# Patient Record
Sex: Female | Born: 1975
Health system: Southern US, Community
[De-identification: ages and names within clinical notes are randomized; demographics above are authoritative.]

## PROBLEM LIST (undated history)

## (undated) DIAGNOSIS — E785 Hyperlipidemia, unspecified: Secondary | ICD-10-CM

## (undated) DIAGNOSIS — C801 Malignant (primary) neoplasm, unspecified: Secondary | ICD-10-CM

## (undated) DIAGNOSIS — K219 Gastro-esophageal reflux disease without esophagitis: Secondary | ICD-10-CM

## (undated) DIAGNOSIS — T7840XA Allergy, unspecified, initial encounter: Secondary | ICD-10-CM

## (undated) HISTORY — DX: Malignant (primary) neoplasm, unspecified: C80.1

## (undated) HISTORY — DX: Gastro-esophageal reflux disease without esophagitis: K21.9

## (undated) HISTORY — DX: Allergy, unspecified, initial encounter: T78.40XA

## (undated) HISTORY — PX: TONSILLECTOMY: SUR1361

## (undated) HISTORY — PX: OTHER SURGICAL HISTORY: SHX169

## (undated) HISTORY — DX: Hyperlipidemia, unspecified: E78.5

## (undated) HISTORY — PX: WISDOM TOOTH EXTRACTION: SHX21

---

## 2000-02-20 ENCOUNTER — Other Ambulatory Visit: Admission: RE | Admit: 2000-02-20 | Discharge: 2000-02-20 | Payer: Self-pay | Admitting: Obstetrics & Gynecology

## 2000-08-31 ENCOUNTER — Encounter: Payer: Self-pay | Admitting: Obstetrics and Gynecology

## 2000-09-01 ENCOUNTER — Inpatient Hospital Stay (HOSPITAL_COMMUNITY): Admission: AD | Admit: 2000-09-01 | Discharge: 2000-09-04 | Payer: Self-pay | Admitting: Obstetrics & Gynecology

## 2000-10-03 ENCOUNTER — Other Ambulatory Visit: Admission: RE | Admit: 2000-10-03 | Discharge: 2000-10-03 | Payer: Self-pay | Admitting: Obstetrics & Gynecology

## 2001-08-29 ENCOUNTER — Other Ambulatory Visit: Admission: RE | Admit: 2001-08-29 | Discharge: 2001-08-29 | Payer: Self-pay | Admitting: Obstetrics and Gynecology

## 2001-11-07 ENCOUNTER — Ambulatory Visit (HOSPITAL_COMMUNITY): Admission: RE | Admit: 2001-11-07 | Discharge: 2001-11-07 | Payer: Self-pay | Admitting: Obstetrics and Gynecology

## 2001-11-07 ENCOUNTER — Encounter: Payer: Self-pay | Admitting: Obstetrics and Gynecology

## 2002-03-06 ENCOUNTER — Inpatient Hospital Stay (HOSPITAL_COMMUNITY): Admission: AD | Admit: 2002-03-06 | Discharge: 2002-03-06 | Payer: Self-pay | Admitting: Obstetrics & Gynecology

## 2002-03-13 ENCOUNTER — Inpatient Hospital Stay (HOSPITAL_COMMUNITY): Admission: AD | Admit: 2002-03-13 | Discharge: 2002-03-16 | Payer: Self-pay | Admitting: Obstetrics & Gynecology

## 2002-10-27 ENCOUNTER — Other Ambulatory Visit: Admission: RE | Admit: 2002-10-27 | Discharge: 2002-10-27 | Payer: Self-pay | Admitting: Obstetrics & Gynecology

## 2003-11-03 ENCOUNTER — Other Ambulatory Visit: Admission: RE | Admit: 2003-11-03 | Discharge: 2003-11-03 | Payer: Self-pay | Admitting: Obstetrics & Gynecology

## 2004-07-20 ENCOUNTER — Inpatient Hospital Stay (HOSPITAL_COMMUNITY): Admission: RE | Admit: 2004-07-20 | Discharge: 2004-07-22 | Payer: Self-pay | Admitting: Obstetrics and Gynecology

## 2005-03-07 ENCOUNTER — Ambulatory Visit: Payer: Self-pay | Admitting: Family Medicine

## 2005-04-07 ENCOUNTER — Other Ambulatory Visit: Admission: RE | Admit: 2005-04-07 | Discharge: 2005-04-07 | Payer: Self-pay | Admitting: Obstetrics and Gynecology

## 2006-01-12 ENCOUNTER — Ambulatory Visit (HOSPITAL_COMMUNITY): Admission: RE | Admit: 2006-01-12 | Discharge: 2006-01-12 | Payer: Self-pay

## 2006-02-11 ENCOUNTER — Emergency Department (HOSPITAL_COMMUNITY): Admission: EM | Admit: 2006-02-11 | Discharge: 2006-02-11 | Payer: Self-pay | Admitting: Emergency Medicine

## 2006-09-26 ENCOUNTER — Ambulatory Visit: Payer: Self-pay | Admitting: Family Medicine

## 2008-09-03 ENCOUNTER — Ambulatory Visit: Payer: Self-pay | Admitting: Family Medicine

## 2008-09-03 DIAGNOSIS — M25569 Pain in unspecified knee: Secondary | ICD-10-CM | POA: Insufficient documentation

## 2008-09-23 ENCOUNTER — Ambulatory Visit: Payer: Self-pay | Admitting: Family Medicine

## 2008-09-23 DIAGNOSIS — L989 Disorder of the skin and subcutaneous tissue, unspecified: Secondary | ICD-10-CM | POA: Insufficient documentation

## 2008-09-25 LAB — CONVERTED CEMR LAB
ALT: 14 units/L (ref 0–35)
AST: 16 units/L (ref 0–37)
Albumin: 4 g/dL (ref 3.5–5.2)
Alkaline Phosphatase: 32 units/L — ABNORMAL LOW (ref 39–117)
BUN: 16 mg/dL (ref 6–23)
Basophils Absolute: 0 10*3/uL (ref 0.0–0.1)
Basophils Relative: 0.3 % (ref 0.0–3.0)
Bilirubin, Direct: 0.1 mg/dL (ref 0.0–0.3)
CO2: 27 meq/L (ref 19–32)
Calcium: 9.2 mg/dL (ref 8.4–10.5)
Chloride: 108 meq/L (ref 96–112)
Cholesterol: 217 mg/dL (ref 0–200)
Creatinine, Ser: 0.8 mg/dL (ref 0.4–1.2)
Direct LDL: 149.5 mg/dL
Eosinophils Absolute: 0.1 10*3/uL (ref 0.0–0.7)
Eosinophils Relative: 1.1 % (ref 0.0–5.0)
GFR calc Af Amer: 106 mL/min
GFR calc non Af Amer: 88 mL/min
Glucose, Bld: 89 mg/dL (ref 70–99)
HCT: 38.5 % (ref 36.0–46.0)
HDL: 53 mg/dL (ref >39.0)
Hemoglobin: 13.3 g/dL (ref 12.0–15.0)
Lymphocytes Relative: 37.7 % (ref 12.0–46.0)
MCHC: 34.6 g/dL (ref 30.0–36.0)
MCV: 89.9 fL (ref 78.0–100.0)
Monocytes Absolute: 0.4 10*3/uL (ref 0.1–1.0)
Monocytes Relative: 7.2 % (ref 3.0–12.0)
Neutro Abs: 2.8 10*3/uL (ref 1.4–7.7)
Neutrophils Relative %: 53.7 % (ref 43.0–77.0)
Platelets: 185 10*3/uL (ref 150–400)
Potassium: 4.2 meq/L (ref 3.5–5.1)
RBC: 4.28 M/uL (ref 3.87–5.11)
RDW: 11.7 % (ref 11.5–14.6)
Sodium: 139 meq/L (ref 135–145)
TSH: 1.59 microintl units/mL (ref 0.35–5.50)
Total Bilirubin: 0.7 mg/dL (ref 0.3–1.2)
Total CHOL/HDL Ratio: 4.1
Total Protein: 7.2 g/dL (ref 6.0–8.3)
Triglycerides: 58 mg/dL (ref 0–149)
VLDL: 12 mg/dL (ref 0–40)
WBC: 5.3 10*3/uL (ref 4.5–10.5)

## 2008-10-02 ENCOUNTER — Ambulatory Visit: Payer: Self-pay | Admitting: Family Medicine

## 2008-10-02 ENCOUNTER — Encounter (INDEPENDENT_AMBULATORY_CARE_PROVIDER_SITE_OTHER): Payer: Self-pay | Admitting: Internal Medicine

## 2010-06-28 ENCOUNTER — Encounter (INDEPENDENT_AMBULATORY_CARE_PROVIDER_SITE_OTHER): Payer: Self-pay | Admitting: *Deleted

## 2010-12-22 NOTE — Letter (Signed)
Summary: Nadara Eaton letter  [facility]  [address] [place], [place] [address]   Phone: [phone]  Fax: [phone]       06/28/2010 MRN: [id number]  [facility] [address] [place], [place]  [address]  Dear Ms. [facility],  [facility], and [facility] announce the retirement of Arta Silence, M.D., from full-time practice at the [facility] office effective May 19, 2010 and his plans of returning part-time.  It is important to Dr. Hetty Ely and to our practice that you understand that [facility] has seven physicians in our office for your health care needs.  We will continue to offer the same exceptional care that you have today.    Dr. Hetty Ely has spoken to many of you about his plans for retirement and returning part-time in the fall.   We will continue to work with you through the transition to schedule appointments for you in the office and meet the high standards that [facility] is committed to.   Again, it is with great pleasure that we share the news that Dr. Hetty Ely will return to [facility] in October of 2011 with a reduced schedule.    If you have any questions, or would like to request an appointment with one of our physicians, please call us at [phone] and press the option for Scheduling an appointment.  We take pleasure in providing you with excellent patient care and look forward to seeing you at your next office visit.  Our [facility] Physicians are:  Tillman Abide, M.D. Laurita Quint, M.D. Roxy Manns, M.D. Kerby Nora, M.D. Hannah Beat, M.D. Ruthe Mannan, M.D. We proudly welcomed Raechel Ache, M.D. and Eustaquio Boyden, M.D. to the practice in July/August 2011.  Sincerely,  [facility]

## 2011-04-07 NOTE — Discharge Summary (Signed)
NAMEMARSHAE, Krista Graham                  ACCOUNT NO.:  [id number]   MEDICAL RECORD NO.:  [id number]          PATIENT TYPE:  INP   LOCATION:  9134                          FACILITY:  [facility]   PHYSICIAN:  Miguel Aschoff, M.D.       DATE OF BIRTH:  11/21/1975   DATE OF ADMISSION:  07/20/2004  DATE OF DISCHARGE:  07/22/2004                                 DISCHARGE SUMMARY   FINAL DIAGNOSES:  1.  Intrauterine pregnancy at 66 and one-seventh weeks gestation.  2.  History of prior cesarean section, desires repeat cesarean section.   PROCEDURE:  Repeat low transverse cesarean section.  Surgeon:  Dr. Conley Simmonds.  Assistant:  Dr. Ilda Mori.  Complications:  None.   This 35 year old G3 P2-0-0-2 presents at 62 and one-seventh weeks gestation  with a history of prior cesarean section in 2003 secondary to a  nonreassuring fetal assessment.  The patient expressed her desires for a  repeat cesarean section throughout her antepartum course.  The patient's  antepartum course was otherwise uncomplicated.  She did have a negative  group B strep culture obtained in the office around 35 weeks.  The patient  was taken to the operating room on July 20, 2004 by Dr. Conley Simmonds where  a repeat low transverse cesarean section is performed with the delivery of a  7-pound 8-ounce female infant with Apgars of 8 and 9.  Delivery went without  complications.  The patient's postoperative course was benign without  significant fevers.  She was felt ready for discharge on postoperative day  #3.  She was sent home on a regular diet, told to decrease activity, told to  continue her prenatal vitamins, was given Tylox one to two q.3h. as needed  for pain, told she could use over-the-counter ibuprofen as needed, was to  follow up in the office in 4 weeks.   LABORATORY DATA ON DISCHARGE:  The patient had a hemoglobin of 10.2; white  blood cell count 10.8; and platelets 141,000.      MB/MEDQ  D:  08/16/2004  T:  08/16/2004   Job:  [id number]

## 2011-04-07 NOTE — Op Note (Signed)
[facility]  PatientADRIENNE, Graham Visit Number: [id number] MRN: [id number]          Service Type: OBS Location: 910A 9144 01 Attending Physician:  Mickle Mallory Dictated by:   Gerrit Friends. Aldona Bar, M.D. Proc. Date: 03/13/02 Admit Date:  03/13/2002                             Operative Report  PREOPERATIVE DIAGNOSES:       Term pregnancy, active labor, nonreassuring tracing.  POSTOPERATIVE DIAGNOSES:      Term pregnancy, active labor, nonreassuring labor, delivery of 8 pound 6 ounce female infant, Apgars 9 and 9, leg cord and body cord.  PROCEDURE:                    Primary low transverse cesarean section.  SURGEON:                      Gerrit Friends. Aldona Bar, M.D.  ANESTHESIA:                   Epidural.  HISTORY:                      This gravida 2, para 1 was admitted in labor on the evening of April 24.  At the time of admission she was 3-4 cm dilated, 80% effaced, vertex at -2 station.  As she was positive for group B Strep, she was prophylaxed with aqueous penicillin and managed expectantly.  At 3 a.m. on April 25 she was approximately 5 cm dilated.  An IUPC was placed and high dose Pitocin was instituted.  At 7:15 a.m. on April 25 she was 6 cm dilated.  Cervix was slightly thicker. Vertex still at -2/-1 and actual complete amniotomy occurred at this time. She was contracting well and her fetal heart was reactive.  I left the hospital and was called at 7:30 to return to the hospital because of a nonreassuring fetal heart tracing.  On examination the patients cervix was unchanged and the variables that were seen which were moderately severe were consistent with a cord problem and because it seemed that delivery was not eminent, decision was made to proceed with a stat cesarean section.  Patient was taken to the operating room for such procedure.  PROCEDURE:                    Patient was taken to the operating room with Foley catheter in  place.  Her epidural was augmented on her way to the operating room.  Once in the operating room she was positioned appropriately. Fetal heart was noted to be adequate and she was prepped and draped in the usual manner for cesarean section.  At this time the procedure was begun.  Pfannenstiel incision was made and with minimal difficulty dissected down sharply to and through the fascia in a low transverse fashion.  Subfascial space was created inferiorly and superiorly. Muscles separated in the midline.  Peritoneum identified appropriately and entered with care taken to avoid the bowel superiorly and the bladder inferiorly.  At this time the vesicouterine peritoneum was incised in a low transverse fashion and pushed off the lower uterine segment with ease.  Sharp incision of the uterus with Metzenbaum scissors was then made and extended laterally with fingers.  At this time delivery of a viable  female infant which cried spontaneously at once was carried out.  There was a body cord and leg cord noted at the time of delivery.  Once the cord was clamped and cut and the infant was passed off to the awaiting team, Apgars were assigned at 9 and 9, and ultimately the baby was taken to the nursery in good condition.  Weight was 8 pounds 6 ounces.  After the cord bloods were collected the placenta was delivered intact.  The uterus was then exteriorized and manually inspected and rendered free of any remaining products of conception.  Good uterine contractility was afforded with slowly given intravenous Pitocin and manual stimulation.  At this time closure of the uterine incision was carried out using #1 Vicryl in a running locked fashion and this was reinforced with several figure-of-eight #1 Vicryl. At this time tubes and ovaries were inspected and noted to be normal.  Uterus was replaced into the abdomen and abdomen lavaged of all free blood and clot. At this time closure of the abdomen was begun  after all counts were noted to be correct and no foreign bodies were noted to be remaining in the operative cavity.  The abdominoperitoneum was closed with 0 Vicryl in a running fashion and muscles secured with same.  Assured of good subfascial hemostasis, the fascia was then reapproximated with 0 Vicryl from angle to midline bilaterally.  Subcutaneous tissue was rendered hemostatic and staples were then used to close the skin.  Sterile pressure dressing was applied.  Patient was transferred to the recovery room in satisfactory condition after having tolerated procedure well.  Estimated blood loss 500 cc.  All counts correct x2.  SUMMARY:                      This patient presented with a nonreassuring fetal heart tracing which came about fairly suddenly and because of being fairly remote from delivery was taken to the operating room for a primary low transverse cesarean section and was found to have a leg cord and a body cord contributing to the moderately severe variables that the patient was experiencing while she was in labor.  She was delivered of an 8 pound 6 ounce female infant with Apgars of 9 and 9.  At the conclusion of the procedure both mother and baby were doing well in their respective recovery areas. Dictated by:   Gerrit Friends. Aldona Bar, M.D. Attending Physician:  Mickle Mallory DD:  03/14/02 TD:  03/14/02 Job: [id number]

## 2011-04-07 NOTE — Discharge Summary (Signed)
[facility]  PatientTIFFANNI, SCARFO Visit Number: [id number] MRN: [id number]          Service Type: OBS Location: 910A 9144 01 Attending Physician:  Mickle Mallory Dictated by:   Leilani Able, P.A. Admit Date:  03/13/2002 Discharge Date: 03/16/2002                             Discharge Summary  FINAL DIAGNOSES:              1. Intrauterine pregnancy at term.                               2. Active labor.                               3. Nonreassuring tracing.  PROCEDURE:                    Primary low transverse cesarean section.  SURGEON:                      Gerrit Friends. Aldona Bar, M.D.  COMPLICATIONS:                None.  HOSPITAL COURSE:              This 35 year old G2, P39, was admitted on the evening of March 13, 2002.  She was admitted in early labor.  She did have positive group B strep culture and therefore was started on a course of penicillin.  At 3 a.m. on the 25th, the patient was approximately 5 cm dilated, 9 EPCs were placed, and high-dose Pitocin was instituted.  On 7:15 a.m. on the 25th, the patient was about 6 cm dilated and cervix was slightly thicker, and vertex was at about a negative 2 to negative 1 station. At this point, her fetal heart strip was reactive.  Fifteen minutes later, Dr. Aldona Bar was called back to the hospital with a nonreassuring fetal heart tracing.  On examination, the patients cervix was unchanged, and variables were moderately severe and were consistent with a cord problem.  At this point, decision made to proceed with a stat cesarean section.  The patient was taken to the operating room by Dr. Annamaria Helling, where a primary low transverse cesarean section was performed with the delivery of an 8 pound 6 ounce female infant with Apgars of 9 and 9.  There was a leg and body cord noted.  The delivery went without complication.  The patients postoperative course was benign without significant fevers.  She  did have some postoperative anemia and was started on iron.  She was felt ready for discharge on postoperative day #2, was sent home on a regular diet.  Told to decrease activities.  DISCHARGE MEDICATIONS:        1. Chromagen q.d.                               2. Tylox 1-2 q.4h. p.r.n. pain.  FOLLOW-UP:                    Was told to follow up in the office in four weeks. Dictated by:   Leilani Able, P.A. Attending Physician:  Aldona Bar,  Rosita Fire DD:  04/03/02 TD:  04/06/02 Job: [id number]

## 2011-04-07 NOTE — Op Note (Signed)
Krista Graham, Krista Graham                            ACCOUNT NO.:  [id number]   MEDICAL RECORD NO.:  [id number]                   PATIENT TYPE:  INP   LOCATION:  9134                                 [facility]ILITY[facility]   PHYSICIAN:  Randye Lobo, M.D.                DATE OF BIRTH:  02-27-1976   DATE OF PROCEDURE:  07/20/2004  DATE OF DISCHARGE:                                 OPERATIVE REPORT   PREOPERATIVE DIAGNOSES:  1.  Intrauterine gestation at 38-1/7 weeks.  2.  History of prior cesarean section, desire for repeat cesarean section.   POSTOPERATIVE DIAGNOSES:  1.  Intrauterine gestation at 38-1/7 weeks.  2.  History of prior cesarean section, desire for repeat cesarean section.   PROCEDURE:  Repeat low segment transverse cesarean section.   SURGEON:  Randye Lobo, M.D.   ASSISTANT:  Ilda Mori, M.D.   ANESTHESIA:  Spinal.   INTRAVENOUS FLUIDS:  3000 mL Ringer's lactate.   ESTIMATED BLOOD LOSS:  800 mL.   URINE OUTPUT:  300 mL.   COMPLICATIONS:  None.   INDICATIONS:  The patient is a 35 year old gravida 3, para 2-0-0-2 Caucasian  female at 38-1/7 weeks' gestation who had a history of a prior cesarean  section in 2003 for a nonreassuring fetal assessment.  The patient expressed  a desire for a repeat cesarean section throughout her antepartum course.  A  discussion was held with the patient regarding risks, benefits and  alternatives and she chose to proceed with a repeat cesarean section.   FINDINGS:  A viable female was delivered at 12:01 p.m. with Apgars of 8 at one  minute and 9 at five minutes.  The weight was later noted to be 7 pounds and  11 ounces.  The placenta had a normal insertion of a three vessel cord.  The  uterus, tubes and ovaries were unremarkable.   SPECIMENS:  None.   DESCRIPTION OF PROCEDURE:  The patient was reidentified in the preoperative  holding area.  She was brought into the operating room where a spinal  anesthetic was administered.  She  was placed in the supine position with a  left lateral tilt, and the abdomen was prepped and a Foley catheter was  sterilely placed inside the bladder.  She was then sterilely draped.   A Pfannenstiel incision was created along the site of the patient's previous  incision.  This was performed sharply with the scalpel.  The incision was  carried down to the fascia using monopolar cautery.  The fascia was then  incised in the midline using the monopolar cautery.  The incision was  carried up bilaterally using Mayo scissors.  The rectus muscles were then  dissected off of the overlying fascia using a sharp dissection superiorly  and inferiorly.  It was apparent that the patient probably had a previous  diastasis recti.  The rectus muscles were  then sharply divided in the  midline where they were partially approximated more superiorly.  The  parietal peritoneum was then entered sharply, and the incision was extended  cranially and caudally with the Metzenbaum scissors.  The bladder retractor  was placed over the bladder and the bladder flap was then created sharply.  The lower uterine segment was then incised transversely with the scalpel and  the incision was extended bilaterally in an upward fashion with bandage  scissors.  The hand was inserted through the incision, and the vertex was  delivered without difficulty.  The nares and mouth and suctioned and the  remainder of the newborn was delivered without difficulty.  The cord was  doubly clamped and cut and the newborn was carried over to the awaiting  pediatricians.  Cord blood was obtained.  The patient received Ancef 1 g  intravenously as well as Pitocin 20 units intravenously.   The placenta was manually extracted and the uterus was exteriorized.  A  moistened lap pad was used to remove any remaining products of conception  from within the uterine cavity, and there were none.  The uterine incision  was closed with a double layer  closure of #1 chromic.  The first was a  running locked layer and the second was an imbricating layer.   The uterus was then returned to the peritoneal cavity after it was suctioned  of any remaining blood.  The abdomen was closed at this time as hemostasis  was excellent.  The peritoneum was closed with a running suture of 2-0  Vicryl.  The rectus muscles were reapproximated in the midline with  interrupted sutures of #1 chromic.  The fascia was closed with a running  suture of 0 Vicryl.  The subcutaneous tissue was irrigated and suctioned and  made hemostatic with monopolar cautery.  The skin was closed with staples  and a sterile bandage was placed over this.   There were no complications to the procedure.  All needle, instrument and  lap counts were correct.   The patient was escorted to the recovery room in stable condition.                                               Randye Lobo, M.D.    BES/MEDQ  D:  07/20/2004  T:  07/21/2004  Job:  [id number]

## 2011-11-21 DIAGNOSIS — C801 Malignant (primary) neoplasm, unspecified: Secondary | ICD-10-CM

## 2011-11-21 HISTORY — DX: Malignant (primary) neoplasm, unspecified: C80.1

## 2014-01-29 LAB — HM PAP SMEAR: HM PAP: NEGATIVE

## 2015-02-18 LAB — HM MAMMOGRAPHY

## 2017-07-30 ENCOUNTER — Encounter: Payer: Self-pay | Admitting: Adult Health

## 2017-07-30 ENCOUNTER — Encounter: Payer: Self-pay | Admitting: Family Medicine

## 2017-07-30 ENCOUNTER — Ambulatory Visit (INDEPENDENT_AMBULATORY_CARE_PROVIDER_SITE_OTHER): Payer: 59 | Admitting: Adult Health

## 2017-07-30 DIAGNOSIS — Z85828 Personal history of other malignant neoplasm of skin: Secondary | ICD-10-CM | POA: Insufficient documentation

## 2017-07-30 DIAGNOSIS — Z0000 Encounter for general adult medical examination without abnormal findings: Secondary | ICD-10-CM | POA: Diagnosis not present

## 2017-07-30 NOTE — Assessment & Plan Note (Signed)
Removed from forehead and L posterior back. Last OV with with Derm was 2016-advised to make appt for f/u. Recommended to use sunscreen and clothing to avoid excessive exposure to the sun.

## 2017-07-30 NOTE — Patient Instructions (Signed)
Heart-Healthy Eating Plan Many factors influence your heart health, including eating and exercise habits. Heart (coronary) risk increases with abnormal blood fat (lipid) levels. Heart-healthy meal planning includes limiting unhealthy fats, increasing healthy fats, and making other small dietary changes. This includes maintaining a healthy body weight to help keep lipid levels within a normal range. What is my plan? Your health care provider recommends that you:  Get no more than __25__% of the total calories in your daily diet from fat.  Limit your intake of saturated fat to less than __5__% of your total calories each day.  Limit the amount of cholesterol in your diet to less than __300__ mg per day.  What types of fat should I choose?  Choose healthy fats more often. Choose monounsaturated and polyunsaturated fats, such as olive oil and canola oil, flaxseeds, walnuts, almonds, and seeds.  Eat more omega-3 fats. Good choices include salmon, mackerel, sardines, tuna, flaxseed oil, and ground flaxseeds. Aim to eat fish at least two times each week.  Limit saturated fats. Saturated fats are primarily found in animal products, such as meats, butter, and cream. Plant sources of saturated fats include palm oil, palm kernel oil, and coconut oil.  Avoid foods with partially hydrogenated oils in them. These contain trans fats. Examples of foods that contain trans fats are stick margarine, some tub margarines, cookies, crackers, and other baked goods. What general guidelines do I need to follow?  Check food labels carefully to identify foods with trans fats or high amounts of saturated fat.  Fill one half of your plate with vegetables and green salads. Eat 4-5 servings of vegetables per day. A serving of vegetables equals 1 cup of raw leafy vegetables,  cup of raw or cooked cut-up vegetables, or  cup of vegetable juice.  Fill one fourth of your plate with whole grains. Look for the word "whole" as  the first word in the ingredient list.  Fill one fourth of your plate with lean protein foods.  Eat 4-5 servings of fruit per day. A serving of fruit equals one medium whole fruit,  cup of dried fruit,  cup of fresh, frozen, or canned fruit, or  cup of 100% fruit juice.  Eat more foods that contain soluble fiber. Examples of foods that contain this type of fiber are apples, broccoli, carrots, beans, peas, and barley. Aim to get 20-30 g of fiber per day.  Eat more home-cooked food and less restaurant, buffet, and fast food.  Limit or avoid alcohol.  Limit foods that are high in starch and sugar.  Avoid fried foods.  Cook foods by using methods other than frying. Baking, boiling, grilling, and broiling are all great options. Other fat-reducing suggestions include: ? Removing the skin from poultry. ? Removing all visible fats from meats. ? Skimming the fat off of stews, soups, and gravies before serving them. ? Steaming vegetables in water or broth.  Lose weight if you are overweight. Losing just 5-10% of your initial body weight can help your overall health and prevent diseases such as diabetes and heart disease.  Increase your consumption of nuts, legumes, and seeds to 4-5 servings per week. One serving of dried beans or legumes equals  cup after being cooked, one serving of nuts equals 1 ounces, and one serving of seeds equals  ounce or 1 tablespoon.  You may need to monitor your salt (sodium) intake, especially if you have high blood pressure. Talk with your health care provider or dietitian to get  more information about reducing sodium. What foods can I eat? Grains  Breads, including [place], white, pita, wheat, raisin, rye, oatmeal, and [place]. Tortillas that are neither fried nor made with lard or trans fat. Low-fat rolls, including hotdog and hamburger buns and English muffins. Biscuits. Muffins. Waffles. Pancakes. Light popcorn. Whole-grain cereals. Flatbread. Melba toast.  Pretzels. Breadsticks. Rusks. Low-fat snacks and crackers, including oyster, saltine, matzo, graham, animal, and rye. Rice and pasta, including brown rice and those that are made with whole wheat. Vegetables All vegetables. Fruits All fruits, but limit coconut. Meats and Other Protein Sources Lean, well-trimmed beef, veal, pork, and lamb. Chicken and [place] without skin. All fish and shellfish. Wild duck, rabbit, pheasant, and venison. Egg whites or low-cholesterol egg substitutes. Dried beans, peas, lentils, and tofu.Seeds and most nuts. Dairy Low-fat or nonfat cheeses, including ricotta, string, and mozzarella. Skim or 1% milk that is liquid, powdered, or evaporated. Buttermilk that is made with low-fat milk. Nonfat or low-fat yogurt. Beverages Mineral water. Diet carbonated beverages. Sweets and Desserts Sherbets and fruit ices. Honey, jam, marmalade, jelly, and syrups. Meringues and gelatins. Pure sugar candy, such as hard candy, jelly beans, gumdrops, mints, marshmallows, and small amounts of dark chocolate. [employer] Eat all sweets and desserts in moderation. Fats and Oils Nonhydrogenated (trans-free) margarines. Vegetable oils, including soybean, sesame, sunflower, olive, peanut, safflower, corn, canola, and cottonseed. Salad dressings or mayonnaise that are made with a vegetable oil. Limit added fats and oils that you use for cooking, baking, salads, and as spreads. Other Cocoa powder. Coffee and tea. All seasonings and condiments. The items listed above may not be a complete list of recommended foods or beverages. Contact your dietitian for more options. What foods are not recommended? Grains Breads that are made with saturated or trans fats, oils, or whole milk. Croissants. Butter rolls. Cheese breads. Sweet rolls. Donuts. Buttered popcorn. Chow mein noodles. High-fat crackers, such as cheese or butter crackers. Meats and Other Protein Sources Fatty meats, such as hotdogs,  short ribs, sausage, spareribs, bacon, ribeye roast or steak, and mutton. High-fat deli meats, such as salami and bologna. Caviar. Domestic duck and goose. Organ meats, such as kidney, liver, sweetbreads, brains, gizzard, chitterlings, and heart. Dairy Cream, sour cream, cream cheese, and creamed cottage cheese. Whole milk cheeses, including blue (bleu), Monterey Jack, [place], [place], [place], [place], Swiss, [place], [place], and [place]. Whole or 2% milk that is liquid, evaporated, or condensed. Whole buttermilk. Cream sauce or high-fat cheese sauce. Yogurt that is made from whole milk. Beverages Regular sodas and drinks with added sugar. Sweets and Desserts Frosting. Pudding. Cookies. Cakes other than angel food cake. Candy that has milk chocolate or white chocolate, hydrogenated fat, butter, coconut, or unknown ingredients. Buttered syrups. Full-fat ice cream or ice cream drinks. Fats and Oils Gravy that has suet, meat fat, or shortening. Cocoa butter, hydrogenated oils, palm oil, coconut oil, palm kernel oil. These can often be found in baked products, candy, fried foods, nondairy creamers, and whipped toppings. Solid fats and shortenings, including bacon fat, salt pork, lard, and butter. Nondairy cream substitutes, such as coffee creamers and sour cream substitutes. Salad dressings that are made of unknown oils, cheese, or sour cream. The items listed above may not be a complete list of foods and beverages to avoid. Contact your dietitian for more information. This information is not intended to replace advice given to you by your health care provider. Make sure you discuss any questions you have with your health care  provider. Document Released: 08/15/2008 Document Revised: 05/26/2016 Document Reviewed: 04/30/2014 Elsevier Interactive Patient Education  2017 [place]! Keep up the regular walking and healthy eating. Please keep your OB/GYN appt this Oct and make  follow-up with Dermatology this fall-if you need a referral please call us. Please schedule nurse visit for fasting labs and complete physical within the year. WELCOME TO THE PRACTICE!

## 2017-07-30 NOTE — Progress Notes (Signed)
Subjective:    Patient ID: Krista Graham, female    DOB: 07/11/1976, 41 y.o.   MRN: [id number]  HPI:  Krista Graham is here to establish as a new pt.  She is a very pleasant 41 year old female.  PMH:  Basal Cell Carcinoma on forehead and L shoulder both successfully removed in 2013.  She was being seen by derm every 6 months, however slacked off and last OV was 2016.  She has upcoming OB/GYN appt Oct 2018 to address PAP/mammogram.  She recently lost >40 lbs in last 2 years r/t regular exercise (walking and light  jogging) and following heart healthy diet.  She denies any acute issues/complaints. She estimates to drink 4-6 12 ounce bottles of water/day.  She is married with 3 teenage children (ages 72, 58, 70).  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 She denies tobacco use and reports only social EOTH consumption.  Patient Care Team    Relationship Specialty Notifications Start End  Krista Ghent, MD PCP - General Family Medicine  02/08/12     Patient Active Problem List   Diagnosis Date Noted  . Hx of basal cell carcinoma 07/30/2017  . Healthcare maintenance 07/30/2017  . SKIN LESION 09/23/2008  . KNEE PAIN, RIGHT 09/03/2008     Past Medical History:  Diagnosis Date  . Cancer [facility]) 2013   basal cell carcinoma     Past Surgical History:  Procedure Laterality Date  . CESAREAN SECTION    . TONSILLECTOMY       Family History  Problem Relation Age of Onset  . Hypothyroidism Mother   . Healthy Daughter   . Healthy Son   . Diabetes Maternal Grandmother   . Heart disease Maternal Grandmother   . Heart disease Maternal Grandfather   . Hypertension Maternal Grandfather   . COPD Maternal Grandfather   . Healthy Son      History  Drug Use No     History  Alcohol Use  . Yes    Comment: socially     History  Smoking Status  . Never Smoker  Smokeless Tobacco  . Never Used     No outpatient encounter prescriptions on file as of 07/30/2017.   No  facility-administered encounter medications on file as of 07/30/2017.     Allergies: Patient has no known allergies.  Body mass index is 24.33 kg/m.  Blood pressure 123/72, pulse 76, height 5' 7.25" (1.708 m), weight 156 lb 8 oz (71 kg), last menstrual period 07/05/2017.     Review of Systems  Constitutional: Positive for fatigue. Negative for activity change, appetite change, chills, diaphoresis, fever and unexpected weight change.  HENT: Negative for congestion.   Eyes: Negative for visual disturbance.  Respiratory: Negative for cough, chest tightness, shortness of breath, wheezing and stridor.   Cardiovascular: Negative for chest pain, palpitations and leg swelling.  Gastrointestinal: Negative for abdominal distention, abdominal pain, blood in stool, constipation, diarrhea, nausea and vomiting.  Endocrine: Negative for cold intolerance, heat intolerance, polydipsia, polyphagia and polyuria.  Genitourinary: Negative for difficulty urinating, flank pain and hematuria.  Musculoskeletal: Negative for arthralgias, back pain, gait problem, joint swelling, myalgias, neck pain and neck stiffness.  Skin: Negative for color change, pallor, rash and wound.  Allergic/Immunologic: Negative for immunocompromised state.  Neurological: Negative for dizziness, tremors, weakness and headaches.  Hematological: Does not bruise/bleed easily.  Psychiatric/Behavioral: Positive for sleep disturbance. Negative for decreased concentration, dysphoric mood, hallucinations, self-injury and suicidal  ideas. The patient is not nervous/anxious and is not hyperactive.        Objective:   Physical Exam  Constitutional: She is oriented to person, place, and time. She appears well-developed and well-nourished. No distress.  HENT:  Head: Normocephalic and atraumatic.  Right Ear: External ear normal.  Left Ear: External ear normal.  Eyes: Pupils are equal, round, and reactive to light. Conjunctivae are normal.   Neck: Normal range of motion.  Cardiovascular: Normal rate, regular rhythm and intact distal pulses.   No murmur heard. Pulmonary/Chest: No respiratory distress. She has no wheezes. She has no rales. She exhibits no tenderness.  Neurological: She is alert and oriented to person, place, and time.  Skin: Skin is warm and dry. No rash noted. She is not diaphoretic. No erythema. No pallor.  Psychiatric: She has a normal mood and affect. Her behavior is normal. Judgment and thought content normal.  Nursing note and vitals reviewed.         Assessment & Plan:   1. Hx of basal cell carcinoma   2. Healthcare maintenance     Hx of basal cell carcinoma Removed from forehead and L posterior back. Last OV with with Derm was 2016-advised to make appt for f/u. Recommended to use sunscreen and clothing to avoid excessive exposure to the sun.  Healthcare maintenance Continue adequate hydration and heart healthy diet. Continue regular walking/light jogging. Please keep OB/GYN appt in Oct and strongly recommend to make f/u with Derm (it has been >2 years since last OV, so if she needs referral instructed to call clinic). Schedule nurse visit for fasting labs and annual CPE.     FOLLOW-UP:  Return in about 1 year (around 07/30/2018) for CPE.

## 2017-07-30 NOTE — Assessment & Plan Note (Signed)
Continue adequate hydration and heart healthy diet. Continue regular walking/light jogging. Please keep OB/GYN appt in Oct and strongly recommend to make f/u with Derm (it has been >2 years since last OV, so if she needs referral instructed to call clinic). Schedule nurse visit for fasting labs and annual CPE.

## 2017-08-06 NOTE — Progress Notes (Signed)
Opened in error

## 2017-08-20 ENCOUNTER — Other Ambulatory Visit: Payer: Self-pay

## 2017-08-20 DIAGNOSIS — Z0000 Encounter for general adult medical examination without abnormal findings: Secondary | ICD-10-CM

## 2017-08-21 ENCOUNTER — Other Ambulatory Visit: Payer: 59

## 2017-08-21 DIAGNOSIS — Z0000 Encounter for general adult medical examination without abnormal findings: Secondary | ICD-10-CM

## 2017-08-22 LAB — CBC WITH DIFFERENTIAL/PLATELET
BASOS: 1 %
Basophils Absolute: 0 10*3/uL (ref 0.0–0.2)
EOS (ABSOLUTE): 0.1 10*3/uL (ref 0.0–0.4)
Eos: 1 %
HEMATOCRIT: 39 % (ref 34.0–46.6)
Hemoglobin: 13 g/dL (ref 11.1–15.9)
Immature Grans (Abs): 0 10*3/uL (ref 0.0–0.1)
Immature Granulocytes: 0 %
LYMPHS: 45 %
Lymphocytes Absolute: 2.2 10*3/uL (ref 0.7–3.1)
MCH: 30.4 pg (ref 26.6–33.0)
MCHC: 33.3 g/dL (ref 31.5–35.7)
MCV: 91 fL (ref 79–97)
Monocytes Absolute: 0.4 10*3/uL (ref 0.1–0.9)
Monocytes: 8 %
NEUTROS PCT: 45 %
Neutrophils Absolute: 2.2 10*3/uL (ref 1.4–7.0)
PLATELETS: 205 10*3/uL (ref 150–379)
RBC: 4.27 x10E6/uL (ref 3.77–5.28)
RDW: 12.9 % (ref 12.3–15.4)
WBC: 4.9 10*3/uL (ref 3.4–10.8)

## 2017-08-22 LAB — LIPID PANEL
CHOL/HDL RATIO: 3 ratio (ref 0.0–4.4)
Cholesterol, Total: 214 mg/dL — ABNORMAL HIGH (ref 100–199)
HDL: 71 mg/dL (ref >39)
LDL Calculated: 132 mg/dL — ABNORMAL HIGH (ref 0–99)
TRIGLYCERIDES: 56 mg/dL (ref 0–149)
VLDL Cholesterol Cal: 11 mg/dL (ref 5–40)

## 2017-08-22 LAB — COMPREHENSIVE METABOLIC PANEL
ALBUMIN: 4.2 g/dL (ref 3.5–5.5)
ALK PHOS: 35 IU/L — AB (ref 39–117)
ALT: 13 IU/L (ref 0–32)
AST: 15 IU/L (ref 0–40)
Albumin/Globulin Ratio: 1.6 (ref 1.2–2.2)
BUN / CREAT RATIO: 15 (ref 9–23)
BUN: 14 mg/dL (ref 6–24)
Bilirubin Total: 0.8 mg/dL (ref 0.0–1.2)
CALCIUM: 9.1 mg/dL (ref 8.7–10.2)
CO2: 24 mmol/L (ref 20–29)
CREATININE: 0.91 mg/dL (ref 0.57–1.00)
Chloride: 106 mmol/L (ref 96–106)
GFR, EST AFRICAN AMERICAN: 90 mL/min/{1.73_m2} (ref >59)
GFR, EST NON AFRICAN AMERICAN: 78 mL/min/{1.73_m2} (ref >59)
Globulin, Total: 2.6 g/dL (ref 1.5–4.5)
Glucose: 88 mg/dL (ref 65–99)
Potassium: 4.2 mmol/L (ref 3.5–5.2)
Sodium: 142 mmol/L (ref 134–144)
TOTAL PROTEIN: 6.8 g/dL (ref 6.0–8.5)

## 2017-08-22 LAB — HEMOGLOBIN A1C
Est. average glucose Bld gHb Est-mCnc: 97 mg/dL
HEMOGLOBIN A1C: 5 % (ref 4.8–5.6)

## 2017-08-22 LAB — VITAMIN D 25 HYDROXY (VIT D DEFICIENCY, FRACTURES): Vit D, 25-Hydroxy: 37 ng/mL (ref 30.0–100.0)

## 2017-08-22 LAB — TSH: TSH: 1.42 u[IU]/mL (ref 0.450–4.500)

## 2017-08-28 ENCOUNTER — Telehealth: Payer: Self-pay | Admitting: Adult Health

## 2017-08-28 NOTE — Telephone Encounter (Signed)
See lab results note.  Charyl Bigger, CMA

## 2017-08-28 NOTE — Telephone Encounter (Signed)
Pt states received a call frm Tonya on yesterday & is returning call --glh

## 2017-08-30 DIAGNOSIS — Z124 Encounter for screening for malignant neoplasm of cervix: Secondary | ICD-10-CM | POA: Diagnosis not present

## 2017-08-30 DIAGNOSIS — Z6823 Body mass index (BMI) 23.0-23.9, adult: Secondary | ICD-10-CM | POA: Diagnosis not present

## 2017-08-30 DIAGNOSIS — Z01419 Encounter for gynecological examination (general) (routine) without abnormal findings: Secondary | ICD-10-CM | POA: Diagnosis not present

## 2017-08-30 DIAGNOSIS — Z1231 Encounter for screening mammogram for malignant neoplasm of breast: Secondary | ICD-10-CM | POA: Diagnosis not present

## 2017-08-30 LAB — HM PAP SMEAR: HM PAP: NEGATIVE

## 2017-08-31 LAB — HM MAMMOGRAPHY

## 2017-10-01 ENCOUNTER — Encounter: Payer: 59 | Admitting: Adult Health

## 2017-12-03 ENCOUNTER — Encounter: Payer: Self-pay | Admitting: Adult Health

## 2017-12-03 ENCOUNTER — Ambulatory Visit (INDEPENDENT_AMBULATORY_CARE_PROVIDER_SITE_OTHER): Payer: 59 | Admitting: Adult Health

## 2017-12-03 VITALS — BP 99/63 | HR 65 | Ht 67.25 in | Wt 159.9 lb

## 2017-12-03 DIAGNOSIS — Z0000 Encounter for general adult medical examination without abnormal findings: Secondary | ICD-10-CM

## 2017-12-03 DIAGNOSIS — Z85828 Personal history of other malignant neoplasm of skin: Secondary | ICD-10-CM | POA: Diagnosis not present

## 2017-12-03 NOTE — Patient Instructions (Signed)
Heart-Healthy Eating Plan Many factors influence your heart health, including eating and exercise habits. Heart (coronary) risk increases with abnormal blood fat (lipid) levels. Heart-healthy meal planning includes limiting unhealthy fats, increasing healthy fats, and making other small dietary changes. This includes maintaining a healthy body weight to help keep lipid levels within a normal range. What is my plan? Your health care provider recommends that you:  Get no more than __25__% of the total calories in your daily diet from fat.  Limit your intake of saturated fat to less than __5___% of your total calories each day.  Limit the amount of cholesterol in your diet to less than __300__ mg per day.  What types of fat should I choose?  Choose healthy fats more often. Choose monounsaturated and polyunsaturated fats, such as olive oil and canola oil, flaxseeds, walnuts, almonds, and seeds.  Eat more omega-3 fats. Good choices include salmon, mackerel, sardines, tuna, flaxseed oil, and ground flaxseeds. Aim to eat fish at least two times each week.  Limit saturated fats. Saturated fats are primarily found in animal products, such as meats, butter, and cream. Plant sources of saturated fats include palm oil, palm kernel oil, and coconut oil.  Avoid foods with partially hydrogenated oils in them. These contain trans fats. Examples of foods that contain trans fats are stick margarine, some tub margarines, cookies, crackers, and other baked goods. What general guidelines do I need to follow?  Check food labels carefully to identify foods with trans fats or high amounts of saturated fat.  Fill one half of your plate with vegetables and green salads. Eat 4-5 servings of vegetables per day. A serving of vegetables equals 1 cup of raw leafy vegetables,  cup of raw or cooked cut-up vegetables, or  cup of vegetable juice.  Fill one fourth of your plate with whole grains. Look for the word "whole"  as the first word in the ingredient list.  Fill one fourth of your plate with lean protein foods.  Eat 4-5 servings of fruit per day. A serving of fruit equals one medium whole fruit,  cup of dried fruit,  cup of fresh, frozen, or canned fruit, or  cup of 100% fruit juice.  Eat more foods that contain soluble fiber. Examples of foods that contain this type of fiber are apples, broccoli, carrots, beans, peas, and barley. Aim to get 20-30 g of fiber per day.  Eat more home-cooked food and less restaurant, buffet, and fast food.  Limit or avoid alcohol.  Limit foods that are high in starch and sugar.  Avoid fried foods.  Cook foods by using methods other than frying. Baking, boiling, grilling, and broiling are all great options. Other fat-reducing suggestions include: ? Removing the skin from poultry. ? Removing all visible fats from meats. ? Skimming the fat off of stews, soups, and gravies before serving them. ? Steaming vegetables in water or broth.  Lose weight if you are overweight. Losing just 5-10% of your initial body weight can help your overall health and prevent diseases such as diabetes and heart disease.  Increase your consumption of nuts, legumes, and seeds to 4-5 servings per week. One serving of dried beans or legumes equals  cup after being cooked, one serving of nuts equals 1 ounces, and one serving of seeds equals  ounce or 1 tablespoon.  You may need to monitor your salt (sodium) intake, especially if you have high blood pressure. Talk with your health care provider or dietitian to get  more information about reducing sodium. What foods can I eat? Grains  Breads, including [place], white, pita, wheat, raisin, rye, oatmeal, and [place]. Tortillas that are neither fried nor made with lard or trans fat. Low-fat rolls, including hotdog and hamburger buns and English muffins. Biscuits. Muffins. Waffles. Pancakes. Light popcorn. Whole-grain cereals. Flatbread. Melba  toast. Pretzels. Breadsticks. Rusks. Low-fat snacks and crackers, including oyster, saltine, matzo, graham, animal, and rye. Rice and pasta, including brown rice and those that are made with whole wheat. Vegetables All vegetables. Fruits All fruits, but limit coconut. Meats and Other Protein Sources Lean, well-trimmed beef, veal, pork, and lamb. Chicken and [place] without skin. All fish and shellfish. Wild duck, rabbit, pheasant, and venison. Egg whites or low-cholesterol egg substitutes. Dried beans, peas, lentils, and tofu.Seeds and most nuts. Dairy Low-fat or nonfat cheeses, including ricotta, string, and mozzarella. Skim or 1% milk that is liquid, powdered, or evaporated. Buttermilk that is made with low-fat milk. Nonfat or low-fat yogurt. Beverages Mineral water. Diet carbonated beverages. Sweets and Desserts Sherbets and fruit ices. Honey, jam, marmalade, jelly, and syrups. Meringues and gelatins. Pure sugar candy, such as hard candy, jelly beans, gumdrops, mints, marshmallows, and small amounts of dark chocolate. [employer] Eat all sweets and desserts in moderation. Fats and Oils Nonhydrogenated (trans-free) margarines. Vegetable oils, including soybean, sesame, sunflower, olive, peanut, safflower, corn, canola, and cottonseed. Salad dressings or mayonnaise that are made with a vegetable oil. Limit added fats and oils that you use for cooking, baking, salads, and as spreads. Other Cocoa powder. Coffee and tea. All seasonings and condiments. The items listed above may not be a complete list of recommended foods or beverages. Contact your dietitian for more options. What foods are not recommended? Grains Breads that are made with saturated or trans fats, oils, or whole milk. Croissants. Butter rolls. Cheese breads. Sweet rolls. Donuts. Buttered popcorn. Chow mein noodles. High-fat crackers, such as cheese or butter crackers. Meats and Other Protein Sources Fatty meats, such as  hotdogs, short ribs, sausage, spareribs, bacon, ribeye roast or steak, and mutton. High-fat deli meats, such as salami and bologna. Caviar. Domestic duck and goose. Organ meats, such as kidney, liver, sweetbreads, brains, gizzard, chitterlings, and heart. Dairy Cream, sour cream, cream cheese, and creamed cottage cheese. Whole milk cheeses, including blue (bleu), Monte[place] Jack, [place], [place], [place], [place], [place], [place], [place], and [place]. Whole or 2% milk that is liquid, evaporated, or condensed. Whole buttermilk. Cream sauce or high-fat cheese sauce. Yogurt that is made from whole milk. Beverages Regular sodas and drinks with added sugar. Sweets and Desserts Frosting. Pudding. Cookies. Cakes other than angel food cake. Candy that has milk chocolate or white chocolate, hydrogenated fat, butter, coconut, or unknown ingredients. Buttered syrups. Full-fat ice cream or ice cream drinks. Fats and Oils Gravy that has suet, meat fat, or shortening. Cocoa butter, hydrogenated oils, palm oil, coconut oil, palm kernel oil. These can often be found in baked products, candy, fried foods, nondairy creamers, and whipped toppings. Solid fats and shortenings, including bacon fat, salt pork, lard, and butter. Nondairy cream substitutes, such as coffee creamers and sour cream substitutes. Salad dressings that are made of unknown oils, cheese, or sour cream. The items listed above may not be a complete list of foods and beverages to avoid. Contact your dietitian for more information. This information is not intended to replace advice given to you by your health care provider. Make sure you discuss any questions you have with your health care  provider. Document Released: 08/15/2008 Document Revised: 05/26/2016 Document Reviewed: 04/30/2014 Elsevier Interactive Patient Education  2018 [employer].  Increase water intake, strive for at least 80 ounces/day.   Follow Heart Healthy diet Continue regular  exercise.  Recommend at least 30 minutes daily, 5 days per week of walking, jogging, biking, swimming, YouTube/Pinterest workout videos. Please follow-up with your Dermatologist. OVERALL YOU ARE DOING A GREAT JOB!!! Annual follow-up, sooner if needed. NICE TO SEE YOU!

## 2017-12-03 NOTE — Assessment & Plan Note (Signed)
Increase water intake, strive for at least 80 ounces/day.   Follow Heart Healthy diet Continue regular exercise.  Recommend at least 30 minutes daily, 5 days per week of walking, jogging, biking, swimming, YouTube/Pinterest workout videos. Please follow-up with your Dermatologist. OVERALL YOU ARE DOING A GREAT JOB!!! Annual follow-up, sooner if needed.

## 2017-12-03 NOTE — Assessment & Plan Note (Signed)
F/u with Krista Graham- she is established pt and does not require referral.

## 2017-12-03 NOTE — Progress Notes (Signed)
Subjective:    Patient ID: Krista Graham, female    DOB: 22-Nov-1975, 42 y.o.   MRN: [id number]  HPI: 07/30/17 OV:  Krista Graham is here to establish as a new pt.  She is a very pleasant 42 year old female.  PMH:  Basal Cell Carcinoma on forehead and L shoulder both successfully removed in 2013.  She was being seen by derm every 6 months, however slacked off and last OV was 2016.  She has upcoming OB/GYN appt Oct 2018 to address PAP/mammogram.  She recently lost >40 lbs in last 2 years r/t regular exercise (walking and light  jogging) and following heart healthy diet.  She denies any acute issues/complaints. She estimates to drink 4-6 12 ounce bottles of water/day.  She is married with 3 teenage children (ages 65, 17, 70).  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 She denies tobacco use and reports only social EOTH consumption.  12/03/17 OV: Krista Graham is here for CPE.  She reports drinking plenty of water, following heart healthy diet, and walking/running 4 days a week.  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 She continues to abstain from tobacco and only consumes ETOH socially. Healthcare maintenance: Immunizations UTD Colonoscopy- not indicated Mammogram-completed 10/18-normal PAP-completed 10/18-normal  Patient Care Team    Relationship Specialty Notifications Start End  Mina Marble D, NP PCP - General Family Medicine  07/30/17   Ob/Gyn, Esmond Plants    07/30/17     Patient Active Problem List   Diagnosis Date Noted  . Hx of basal cell carcinoma 07/30/2017  . Healthcare maintenance 07/30/2017  . SKIN LESION 09/23/2008  . KNEE PAIN, RIGHT 09/03/2008     Past Medical History:  Diagnosis Date  . Cancer [facility]) 2013   basal cell carcinoma     Past Surgical History:  Procedure Laterality Date  . CESAREAN SECTION    . TONSILLECTOMY       Family History   Problem Relation Age of Onset  . Hypothyroidism Mother   . Healthy Daughter   . Healthy Son   . Diabetes Maternal Grandmother   . Heart disease Maternal Grandmother   . Heart disease Maternal Grandfather   . Hypertension Maternal Grandfather   . COPD Maternal Grandfather   . Healthy Son      Social History   Substance and Sexual Activity  Drug Use No     Social History   Substance and Sexual Activity  Alcohol Use Yes   Comment: socially     Social History   Tobacco Use  Smoking Status Never Smoker  Smokeless Tobacco Never Used     No outpatient encounter medications on file as of 12/03/2017.   No facility-administered encounter medications on file as of 12/03/2017.     Allergies: Patient has no known allergies.  Body mass index is 24.86 kg/m.  Blood pressure 99/63, pulse 65, height 5' 7.25" (1.708 m), weight 159 lb 14.4 oz (72.5 kg), last menstrual period 11/27/2017, SpO2 100 %.     Review of Systems  Constitutional: Positive for fatigue. Negative for activity change, appetite change, chills, diaphoresis, fever and unexpected weight change.  HENT: Negative for congestion.   Eyes: Negative for visual disturbance.  Respiratory: Negative for cough, chest tightness, shortness of breath, wheezing and stridor.   Cardiovascular: Negative for chest pain, palpitations  and leg swelling.  Gastrointestinal: Negative for abdominal distention, abdominal pain, blood in stool, constipation, diarrhea, nausea and vomiting.  Endocrine: Negative for cold intolerance, heat intolerance, polydipsia, polyphagia and polyuria.  Genitourinary: Negative for difficulty urinating, flank pain and hematuria.  Musculoskeletal: Negative for arthralgias, back pain, gait problem, joint swelling, myalgias, neck pain and neck stiffness.  Skin: Negative for color change, pallor, rash and wound.  Allergic/Immunologic: Negative for immunocompromised state.  Neurological: Negative for  dizziness, tremors, weakness and headaches.  Hematological: Does not bruise/bleed easily.  Psychiatric/Behavioral: Positive for sleep disturbance. Negative for decreased concentration, dysphoric mood, hallucinations, self-injury and suicidal ideas. The patient is not nervous/anxious and is not hyperactive.        Objective:   Physical Exam  Constitutional: She is oriented to person, place, and time. She appears well-developed and well-nourished. No distress.  HENT:  Head: Normocephalic and atraumatic.  Right Ear: Hearing, tympanic membrane, external ear and ear canal normal. Tympanic membrane is not erythematous and not bulging. No decreased hearing is noted.  Left Ear: Hearing, tympanic membrane, external ear and ear canal normal. Tympanic membrane is not erythematous and not bulging. No decreased hearing is noted.  Nose: No mucosal edema. Right sinus exhibits no maxillary sinus tenderness and no frontal sinus tenderness. Left sinus exhibits no maxillary sinus tenderness and no frontal sinus tenderness.  Mouth/Throat: Uvula is midline, oropharynx is clear and moist and mucous membranes are normal.  Eyes: Conjunctivae are normal. Pupils are equal, round, and reactive to light.  Neck: Normal range of motion. Neck supple.  Cardiovascular: Normal rate, regular rhythm and intact distal pulses.  No murmur heard. Pulmonary/Chest: No respiratory distress. She has no decreased breath sounds. She has no wheezes. She has no rhonchi. She has no rales. She exhibits no tenderness. Right breast exhibits no inverted nipple, no mass, no nipple discharge, no skin change and no tenderness. Left breast exhibits no inverted nipple, no mass, no nipple discharge, no skin change and no tenderness. Breasts are symmetrical.  Abdominal: Soft. Bowel sounds are normal. She exhibits no distension and no mass. There is no tenderness. There is no rebound and no guarding.  Genitourinary:  Genitourinary Comments: Declined PAP  today- complete with OB/GYN 10/18, last one normal  Musculoskeletal: Normal range of motion.  Lymphadenopathy:    She has no cervical adenopathy.  Neurological: She is alert and oriented to person, place, and time.  Skin: Skin is warm and dry. No rash noted. She is not diaphoretic. No erythema. No pallor.  Psychiatric: She has a normal mood and affect. Her behavior is normal. Judgment and thought content normal.  Nursing note and vitals reviewed.         Assessment & Plan:   1. Hx of basal cell carcinoma   2. Healthcare maintenance     Healthcare maintenance Increase water intake, strive for at least 80 ounces/day.   Follow Heart Healthy diet Continue regular exercise.  Recommend at least 30 minutes daily, 5 days per week of walking, jogging, biking, swimming, YouTube/Pinterest workout videos. Please follow-up with your Dermatologist. OVERALL YOU ARE DOING A GREAT JOB!!! Annual follow-up, sooner if needed.  Hx of basal cell carcinoma F/u with Lyndle Herrlich- she is established pt and does not require referral.    FOLLOW-UP:  Return in about 1 year (around 12/03/2018) for CPE, Fasting Labs.

## 2018-08-07 ENCOUNTER — Ambulatory Visit (INDEPENDENT_AMBULATORY_CARE_PROVIDER_SITE_OTHER): Payer: 59 | Admitting: Adult Health

## 2018-08-07 ENCOUNTER — Encounter: Payer: Self-pay | Admitting: Adult Health

## 2018-08-07 ENCOUNTER — Ambulatory Visit: Payer: 59

## 2018-08-07 VITALS — BP 124/78 | HR 82 | Temp 98.6°F | Resp 16 | Ht 67.0 in | Wt 177.0 lb

## 2018-08-07 DIAGNOSIS — M25562 Pain in left knee: Secondary | ICD-10-CM

## 2018-08-07 MED ORDER — MELOXICAM 7.5 MG PO TABS: 7.5000 mg | ORAL_TABLET | Freq: Two times a day (BID) | ORAL | 0 refills | Status: DC

## 2018-08-07 NOTE — Patient Instructions (Addendum)
Knee Pain, Adult Knee pain in adults is common. It can be caused by many things, including:  Arthritis.  A fluid-filled sac (cyst) or growth in your knee.  An infection in your knee.  An injury that will not heal.  Damage, swelling, or irritation of the tissues that support your knee.  Knee pain is usually not a sign of a serious problem. The pain may go away on its own with time and rest. If it does not, a health care provider may order tests to find the cause of the pain. These may include:  Imaging tests, such as an X-ray, MRI, or ultrasound.  Joint aspiration. In this test, fluid is removed from the knee.  Arthroscopy. In this test, a lighted tube is inserted into knee and an image is projected onto a TV screen.  A biopsy. In this test, a sample of tissue is removed from the body and studied under a microscope.  Follow these instructions at home: Pay attention to any changes in your symptoms. Take these actions to relieve your pain. Activity  Rest your knee.  Do not do things that cause pain or make pain worse.  Avoid high-impact activities or exercises, such as running, jumping rope, or doing jumping jacks. General instructions  Take over-the-counter and prescription medicines only as told by your health care provider.  Raise (elevate) your knee above the level of your heart when you are sitting or lying down.  Sleep with a pillow under your knee.  If directed, apply ice to the knee: ? Put ice in a plastic bag. ? Place a towel between your skin and the bag. ? Leave the ice on for 20 minutes, 2-3 times a day.  Ask your health care provider if you should wear an elastic knee support.  Lose weight if you are overweight. Extra weight can put pressure on your knee.  Do not use any products that contain nicotine or tobacco, such as cigarettes and e-cigarettes. Smoking may slow the healing of any bone and joint problems that you may have. If you need help quitting, ask  your health care provider. Contact a health care provider if:  Your knee pain continues, changes, or gets worse.  You have a fever along with knee pain.  Your knee buckles or locks up.  Your knee swells, and the swelling becomes worse. Get help right away if:  Your knee feels warm to the touch.  You cannot move your knee.  You have severe pain in your knee.  You have chest pain.  You have trouble breathing. Summary  Knee pain in adults is common. It can be caused by many things, including, arthritis, infection, cysts, or injury.  Knee pain is usually not a sign of a serious problem, but if it does not go away, a health care provider may perform tests to know the cause of the pain.  Pay attention to any changes in your symptoms. Relieve your pain with rest, medicines, light activity, and use of ice.  Get help if your pain continues or becomes very severe, or if your knee buckles or locks up, or if you have chest pain or trouble breathing. This information is not intended to replace advice given to you by your health care provider. Make sure you discuss any questions you have with your health care provider. Document Released: 09/03/2007 Document Revised: 10/27/2016 Document Reviewed: 10/27/2016 Elsevier Interactive Patient Education  2018 [employer].  Please use Meloxicam 7.5mg  twice daily as needed  for pain/edema. Recommend rest, elevation, and ice- 76mins several times/day. Xray completed today. Referral to Physical Therapy (PT) placed. Please follow-up after PT is complete. NICE TO SEE YOU

## 2018-08-07 NOTE — Assessment & Plan Note (Signed)
Please use Meloxicam 7.5mg  twice daily as needed for pain/edema. Recommend rest, elevation, and ice- 66mins several times/day. Xray -  Referral to Physical Therapy (PT) placed. Please follow-up after PT is complete.

## 2018-08-07 NOTE — Addendum Note (Signed)
Addended by: Mina Marble D on: 08/07/2018 02:53 PM   Modules accepted: Level of Service

## 2018-08-07 NOTE — Progress Notes (Signed)
Subjective:    Patient ID: Krista Graham, female    DOB: Jul 23, 1976, 42 y.o.   MRN: [id number]  HPI:  Ms. Krista Graham presents for L knee pain and edema that initially started 4-5 months ago. She denies acute injury/trauma prior to onset of sx's. She reports pain "on the side and behind my left knee". Pain will worsen with prolonged standing, walking. Pain will only minimally reduce with rest and OTC NSAIDs, She denies crepitus or instability of L knee. She denies numbness/tingling of L foot.  Patient Care Team    Relationship Specialty Notifications Start End  Mina Marble D, NP PCP - General Family Medicine  07/30/17   Ob/Gyn, Esmond Plants    07/30/17     Patient Active Problem List   Diagnosis Date Noted  . Acute pain of left knee 08/07/2018  . Hx of basal cell carcinoma 07/30/2017  . Healthcare maintenance 07/30/2017  . SKIN LESION 09/23/2008  . KNEE PAIN, RIGHT 09/03/2008     Past Medical History:  Diagnosis Date  . Cancer [facility]) 2013   basal cell carcinoma     Past Surgical History:  Procedure Laterality Date  . CESAREAN SECTION    . TONSILLECTOMY       Family History  Problem Relation Age of Onset  . Hypothyroidism Mother   . Healthy Daughter   . Healthy Son   . Diabetes Maternal Grandmother   . Heart disease Maternal Grandmother   . Heart disease Maternal Grandfather   . Hypertension Maternal Grandfather   . COPD Maternal Grandfather   . Healthy Son      Social History   Substance and Sexual Activity  Drug Use No     Social History   Substance and Sexual Activity  Alcohol Use Yes   Comment: socially     Social History   Tobacco Use  Smoking Status Never Smoker  Smokeless Tobacco Never Used     Outpatient Encounter Medications as of 08/07/2018  Medication Sig  . ibuprofen (ADVIL,MOTRIN) 200 MG tablet Take 200 mg by mouth every 6 (six) hours as needed.  . meloxicam (MOBIC) 7.5 MG tablet Take 1 tablet (7.5 mg total) by mouth 2 (two) times  daily.   No facility-administered encounter medications on file as of 08/07/2018.     Allergies: Patient has no known allergies.  Body mass index is 27.72 kg/m.  Blood pressure 124/78, pulse 82, temperature 98.6 F (37 C), resp. rate 16, height 5\' 7"  (1.702 m), weight 177 lb (80.3 kg), last menstrual period 07/04/2018, SpO2 100 %.  Review of Systems  Constitutional: Positive for fatigue. Negative for activity change, appetite change, chills, diaphoresis, fever and unexpected weight change.  Musculoskeletal: Positive for arthralgias, gait problem, joint swelling and myalgias. Negative for back pain, neck pain and neck stiffness.  Skin: Negative for color change, pallor, rash and wound.  Hematological: Does not bruise/bleed easily.       Objective:   Physical Exam  Constitutional: She is oriented to person, place, and time. She appears well-developed and well-nourished. No distress.  HENT:  Head: Normocephalic and atraumatic.  Right Ear: External ear normal.  Left Ear: External ear normal.  Nose: Nose normal.  Mouth/Throat: Oropharynx is clear and moist.  Eyes: Pupils are equal, round, and reactive to light. Conjunctivae and EOM are normal.  Musculoskeletal: She exhibits edema and tenderness. She exhibits no deformity.       Left hip: Normal.       Right knee:  Normal.       Left knee: She exhibits swelling. She exhibits normal range of motion, no ecchymosis, no erythema, normal alignment, no LCL laxity, normal patellar mobility and no bony tenderness. Tenderness found. Lateral joint line and patellar tendon tenderness noted.       Left ankle: Normal.  L Popliteal fossa  Neurological: She is alert and oriented to person, place, and time.  Skin: Skin is warm and dry. Capillary refill takes less than 2 seconds. No rash noted. She is not diaphoretic. No erythema. No pallor.  Psychiatric: She has a normal mood and affect. Her behavior is normal. Judgment and thought content normal.       Assessment & Plan:   1. Acute pain of left knee    Acute pain of left knee Please use Meloxicam 7.5mg  twice daily as needed for pain/edema. Recommend rest, elevation, and ice- 68mins several times/day. Xray -  Referral to Physical Therapy (PT) placed. Please follow-up after PT is complete.  FOLLOW-UP:  Return Follow-up after Physical Therapy is completed.

## 2018-08-27 ENCOUNTER — Ambulatory Visit: Payer: 59 | Attending: Adult Health

## 2018-08-27 ENCOUNTER — Other Ambulatory Visit: Payer: Self-pay

## 2018-08-27 DIAGNOSIS — M25562 Pain in left knee: Secondary | ICD-10-CM | POA: Insufficient documentation

## 2018-08-27 DIAGNOSIS — R600 Localized edema: Secondary | ICD-10-CM | POA: Insufficient documentation

## 2018-08-27 DIAGNOSIS — G8929 Other chronic pain: Secondary | ICD-10-CM | POA: Insufficient documentation

## 2018-08-27 DIAGNOSIS — M6281 Muscle weakness (generalized): Secondary | ICD-10-CM | POA: Insufficient documentation

## 2018-08-27 NOTE — Patient Instructions (Signed)
Hip Extension: 2-4 Inches    Tighten gluteal muscle. Lift one leg 10-20___ times. Restabilize pelvis. Repeat with other leg. Keep pelvis still. Be sure pelvis does not rotate and back does not arch. Do __1-2_ sets, _1__ times per day.                     ALSO DO WITH KNEE BENT  http://ss.exer.us/63   Copyright  VHI. All rights reserved.  Hip Flexion / Knee Extension: Straight-Leg Raise (Eccentric)    Lie on back. Lift leg with knee straight. Slowly lower leg for 3-5 seconds. _10-20__ reps per set, _1-2__ sets per day, _5-7__ days per week. Lower like elevator, stopping at each floor. Add ___ lbs when you achieve ___ repetitions. Rest on elbows. Rest on straight arms.  Copyright  VHI. All rights reserved.  Hip Abduction: Modified    Lying on right side with pillow between thighs, raise top leg from pillow, rotating slightly out. Repeat __10-20__ times per set. Do _1-2___ sets per session. Do __1-2__ sessions per day. DON'T ROLL LEG OUT  http://orth.exer.us/705   Copyright  VHI. All rights reserved.  Clam    Lie on side, legs bent 90. Open top knee to ceiling, rotating leg outward. Touch toes to ankle of bottom leg. Close knees, rotating leg inward. Maintain hip position. Repeat _10-20___ times. Repeat on other side. Do _1-2___ sessions per day.  http://pm.exer.us/69   Copyright  VHI. All rights reserved.

## 2018-08-27 NOTE — Therapy (Signed)
[place], [place], [address] Phone: [phone]   Fax:  [phone]  Physical Therapy Evaluation  Patient Details  Name: Krista Graham MRN: [id number] Date of Birth: 12/20/1975 Referring Provider (PT): Krista Marble, NP   Encounter Date: 08/27/2018  PT End of Session - 08/27/18 1036    Visit Number  1    Number of Visits  6    Date for PT Re-Evaluation  10/18/18    Authorization Type  [facility]    PT Start Time  1000    PT Stop Time  0140    PT Time Calculation (min)  940 min    Activity Tolerance  Patient tolerated treatment well;No increased pain    Behavior During Therapy  WFL for tasks assessed/performed       Past Medical History:  Diagnosis Date  . Cancer [facility]) 2013   basal cell carcinoma    Past Surgical History:  Procedure Laterality Date  . CESAREAN SECTION    . TONSILLECTOMY      There were no vitals filed for this visit.   Subjective Assessment - 08/27/18 1009    Subjective  Reports onset of pain without injury and knee began to have swelling about a month ago. Swelling occurs occasionally.  Bending give internal pain.     Limitations  --   does all normal activity.    How long can you walk comfortably?  As needed    Diagnostic tests  xray negative    Patient Stated Goals  To figure out why pain and decr pain    Currently in Pain?  No/denies    Pain Score  5    in past week   Pain Location  Knee    Pain Orientation  Left;Lateral;Posterior    Pain Descriptors / Indicators  Aching    Pain Type  Chronic pain    Pain Onset  More than a month ago    Pain Frequency  Intermittent    Aggravating Factors   bending  knee     Pain Relieving Factors  rest, medication , ice    Multiple Pain Sites  No         [facility] PT Assessment - 08/27/18 0001      Assessment   Medical Diagnosis  acute left knee pain.     Referring Provider (PT)  Krista Marble, NP    Onset Date/Surgical Date  --   5  months ago   Next MD Visit  11/2018    Prior Therapy  no      Precautions   Precautions  None      Restrictions   Weight Bearing Restrictions  No      Balance Screen   Has the patient fallen in the past 6 months  No      Prior Function   Level of Independence  Independent      Cognition   Overall Cognitive Status  Within Functional Limits for tasks assessed      Observation/Other Assessments   Focus on Therapeutic Outcomes (FOTO)   27% limited      ROM / Strength   AROM / PROM / Strength  AROM;PROM;Strength      AROM   AROM Assessment Site  Knee    Right/Left Knee  Right;Left      PROM   PROM Assessment Site  Knee    Right/Left Knee  Left;Right    Right Knee Extension  0    Right Knee Flexion  155    Left Knee Extension  0    Left Knee Flexion  148      Strength   Overall Strength Comments  WNL  RT leg LT hip and quads 4+/5       Flexibility   Soft Tissue Assessment /Muscle Length  yes    Hamstrings  SLR LT 60  RT 65      Normal ambulation .    Mild anterior knee edema          Objective measurements completed on examination: See above findings.              PT Education - 08/27/18 1036    Education Details  POC HEP    Person(s) Educated  Patient    Methods  Explanation;Demonstration;Tactile cues;Verbal cues;Handout    Comprehension  Returned demonstration;Verbalized understanding       PT Short Term Goals - 08/27/18 1045      PT SHORT TERM GOAL #1   Title  She will be independent in initial HEP     Time  1    Period  Weeks    Status  New      PT SHORT TERM GOAL #2   Title  She will report pain decreased 25% or mroe with normal walking activity    Time  3    Period  Weeks    Status  New        PT Long Term Goals - 08/27/18 1045      PT LONG TERM GOAL #1   Title  She will be indpendent with all hEp issued    Time  6    Period  Weeks    Status  New      PT LONG TERM GOAL #2   Title  She will have equal ROm Lt to RT  knee and no pain    Time  6    Period  Weeks    Status  New      PT LONG TERM GOAL #3   Title  She will report returning to normal walking program without pain    Time  6    Period  Weeks    Status  New      PT LONG TERM GOAL #4   Title  She will have 5/5 strength LT hip and knee     Time  6    Period  Weeks    Status  New             Plan - 08/27/18 1037    Clinical Impression Statement  Ms Krista Graham presents with decr ROM flexion  and LT knee pain , and LT hip and quad weakness.  These are not stopping activity but limiting time and frequency of activity.   Swelling and pain are variable     Clinical Presentation  Stable    Clinical Decision Making  Low    Rehab Potential  Good    PT Frequency  1x / week    PT Duration  6 weeks    PT Treatment/Interventions  Manual techniques;Patient/family education;Therapeutic exercise;Therapeutic activities;Cryotherapy;Iontophoresis 4mg /ml Dexamethasone;Taping    PT Next Visit Plan  REveiw HEP and add weight /bands if no problem . Trial of closed chain exer. modalities PRn    PT Home Exercise Plan  Clam and reverse clam sid lye, SLR , prone hip ext knee straingt and bent, side abduction  Consulted and Agree with Plan of Care  Patient       Patient will benefit from skilled therapeutic intervention in order to improve the following deficits and impairments:  Pain, Decreased activity tolerance, Decreased range of motion, Decreased strength, Difficulty walking  Visit Diagnosis: Localized edema - Plan: PT plan of care cert/re-cert  Muscle weakness (generalized) - Plan: PT plan of care cert/re-cert  Chronic pain of left knee - Plan: PT plan of care cert/re-cert     Problem List Patient Active Problem List   Diagnosis Date Noted  . Acute pain of left knee 08/07/2018  . Hx of basal cell carcinoma 07/30/2017  . Healthcare maintenance 07/30/2017  . SKIN LESION 09/23/2008  . KNEE PAIN, RIGHT 09/03/2008    Darrel Hoover   PT 08/27/2018, 10:52 AM  [facility] [address] [place], [place], [address] Phone: [phone]   Fax:  [phone]  Name: Krista Graham MRN: [id number] Date of Birth: 1976-04-11

## 2018-09-05 ENCOUNTER — Ambulatory Visit: Payer: 59

## 2018-09-05 DIAGNOSIS — G8929 Other chronic pain: Secondary | ICD-10-CM | POA: Diagnosis not present

## 2018-09-05 DIAGNOSIS — M25562 Pain in left knee: Secondary | ICD-10-CM | POA: Diagnosis not present

## 2018-09-05 DIAGNOSIS — R600 Localized edema: Secondary | ICD-10-CM

## 2018-09-05 DIAGNOSIS — M6281 Muscle weakness (generalized): Secondary | ICD-10-CM | POA: Diagnosis not present

## 2018-09-05 NOTE — Therapy (Addendum)
[place], [place], [address] Phone: [phone]   Fax:  [phone]  Physical Therapy Treatment/Discharge  Patient Details  Name: MITA VALLO MRN: [id number] Date of Birth: 02-14-1976 Referring Provider (PT): Mina Marble, NP   Encounter Date: 09/05/2018  PT End of Session - 09/05/18 1056    Visit Number  2    Number of Visits  6    Date for PT Re-Evaluation  10/18/18    Authorization Type  [facility]    PT Start Time  1055    PT Stop Time  1135    PT Time Calculation (min)  40 min    Activity Tolerance  Patient tolerated treatment well;No increased pain    Behavior During Therapy  WFL for tasks assessed/performed       Past Medical History:  Diagnosis Date  . Cancer [facility]) 2013   basal cell carcinoma    Past Surgical History:  Procedure Laterality Date  . CESAREAN SECTION    . TONSILLECTOMY      There were no vitals filed for this visit.  Subjective Assessment - 09/05/18 1058    Subjective  Doing well today.     Currently in Pain?  No/denies                       [facility] Adult PT Treatment/Exercise - 09/05/18 0001      Exercises   Exercises  Knee/Hip      Knee/Hip Exercises: Aerobic   Recumbent Bike  L3  5 min      Knee/Hip Exercises: Supine   Straight Leg Raises  Strengthening;Right;Left;10 reps    Straight Leg Raises Limitations  1 set no weight 1 set 3 pounds      Knee/Hip Exercises: Sidelying   Hip ABduction  Right;Left;2 sets;10 reps    Hip ABduction Limitations  1 set with 3 pounds    Clams  RT/Lt 2 sets 1 set with 3 pounds knee then reverse clam weight on ankle.       Knee/Hip Exercises: Prone   Straight Leg Raises  Right;Left;2 sets;10 reps    Straight Leg Raises Limitations  1 set with 3 pounds       See HEP for standing exercises      PT Education - 09/05/18 1141    Education Details  HEP and added bands for mat exercises    Person(s) Educated   Patient    Methods  Explanation;Demonstration;Tactile cues;Verbal cues;Handout    Comprehension  Returned demonstration;Verbalized understanding       PT Short Term Goals - 09/05/18 1142      PT SHORT TERM GOAL #1   Title  She will be independent in initial HEP     Status  Achieved      PT SHORT TERM GOAL #2   Title  She will report pain decreased 25% or mroe with normal walking activity    Status  On-going        PT Long Term Goals - 08/27/18 1045      PT LONG TERM GOAL #1   Title  She will be indpendent with all hEp issued    Time  6    Period  Weeks    Status  New      PT LONG TERM GOAL #2   Title  She will have equal ROm Lt to RT knee and no pain    Time  6  Period  Weeks    Status  New      PT LONG TERM GOAL #3   Title  She will report returning to normal walking program without pain    Time  6    Period  Weeks    Status  New      PT LONG TERM GOAL #4   Title  She will have 5/5 strength LT hip and knee     Time  6    Period  Weeks    Status  New            Plan - 09/05/18 1056    Clinical Impression Statement  Doing well with exercises. No  pain post .  Agreed to added HEP.     PT Treatment/Interventions  Manual techniques;Patient/family education;Therapeutic exercise;Therapeutic activities;Cryotherapy;Iontophoresis 68m/ml Dexamethasone;Taping    PT Next Visit Plan  REveiw HEP and add weight /bands if no problem . Trial of closed chain exer. modalities PRn    PT Home Exercise Plan  Clam and reverse clam sid lye, SLR , prone hip ext knee straingt and bent, side abduction, bands with mat exer, double hip hinge , step ups and downs.     Consulted and Agree with Plan of Care  Patient       Patient will benefit from skilled therapeutic intervention in order to improve the following deficits and impairments:  Pain, Decreased activity tolerance, Decreased range of motion, Decreased strength, Difficulty walking  Visit Diagnosis: Muscle weakness  (generalized)  Chronic pain of left knee  Localized edema     Problem List Patient Active Problem List   Diagnosis Date Noted  . Acute pain of left knee 08/07/2018  . Hx of basal cell carcinoma 07/30/2017  . Healthcare maintenance 07/30/2017  . SKIN LESION 09/23/2008  . KNEE PAIN, RIGHT 09/03/2008    CDarrel Hoover PT 09/05/2018, 11:43 AM  [facility] [place] [address]Phone: [phone]  Fax:  [phone] Name: MSHTERNA LARAMEEMRN: [id number] of Birth: [birth year] PHYSICAL THERAPY DISCHARGE SUMMARY  Visits from Start of Care: 2  Current functional level related to goals / functional outcomes: Unknown as she canceled the next 3 sessions and did not return   Remaining deficits: Unknown   Education / Equipment: HEP  Plan:                                                    Patient goals were not met. Patient is being discharged due to not returning since the last visit.  ?????    SPearson Forster PT  10/31/18

## 2018-09-05 NOTE — Patient Instructions (Signed)
Step: Up, Lateral    Stand with side toward step. Place involved leg up. Raise body using top leg only. Step off other side, involved leg first, lower body using other leg. Repeat ____ times per set. Do ____ sets per session. Do ____ sessions per week.  Copyright  VHI. All rights reserved.  Quad Strength: Quarter Squat With Tubing    With feet shoulder-width apart, stand on tubing and pull to hip height. Bend knees to 30-45. Return. Repeat ____ times or for ____ minutes. Do ____ sessions per day. CAUTION: You should not bend knees deep enough to cause pain.  http://cc.exer.us/15   Copyright  VHI. All rights reserved.

## 2018-09-13 ENCOUNTER — Ambulatory Visit: Payer: 59 | Admitting: Physical Therapy

## 2018-09-18 ENCOUNTER — Ambulatory Visit: Payer: 59 | Admitting: Physical Therapy

## 2018-09-25 ENCOUNTER — Ambulatory Visit: Payer: 59 | Admitting: Physical Therapy
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
## 2018-12-05 ENCOUNTER — Encounter: Payer: Self-pay | Admitting: Adult Health

## 2018-12-05 ENCOUNTER — Ambulatory Visit (INDEPENDENT_AMBULATORY_CARE_PROVIDER_SITE_OTHER): Payer: 59 | Admitting: Adult Health

## 2018-12-05 VITALS — BP 114/70 | HR 78 | Temp 98.6°F | Ht 67.25 in | Wt 170.7 lb

## 2018-12-05 DIAGNOSIS — Z0000 Encounter for general adult medical examination without abnormal findings: Secondary | ICD-10-CM | POA: Diagnosis not present

## 2018-12-05 DIAGNOSIS — M25561 Pain in right knee: Secondary | ICD-10-CM

## 2018-12-05 DIAGNOSIS — G4700 Insomnia, unspecified: Secondary | ICD-10-CM | POA: Diagnosis not present

## 2018-12-05 MED ORDER — LORAZEPAM 0.5 MG PO TABS: ORAL_TABLET | ORAL | 0 refills | Status: DC

## 2018-12-05 MED ORDER — MELOXICAM 7.5 MG PO TABS: 7.5000 mg | ORAL_TABLET | Freq: Two times a day (BID) | ORAL | 0 refills | Status: DC

## 2018-12-05 NOTE — Patient Instructions (Addendum)
Preventive Care for Adults, Female  A healthy lifestyle and preventive care can promote health and wellness. Preventive health guidelines for women include the following key practices.   A routine yearly physical is a good way to check with your health care provider about your health and preventive screening. It is a chance to share any concerns and updates on your health and to receive a thorough exam.   Visit your dentist for a routine exam and preventive care every 6 months. Brush your teeth twice a day and floss once a day. Good oral hygiene prevents tooth decay and gum disease.   The frequency of eye exams is based on your age, health, family medical history, use of contact lenses, and other factors. Follow your health care provider's recommendations for frequency of eye exams.   Eat a healthy diet. Foods like vegetables, fruits, whole grains, low-fat dairy products, and lean protein foods contain the nutrients you need without too many calories. Decrease your intake of foods high in solid fats, added sugars, and salt. Eat the right amount of calories for you.Get information about a proper diet from your health care provider, if necessary.   Regular physical exercise is one of the most important things you can do for your health. Most adults should get at least 150 minutes of moderate-intensity exercise (any activity that increases your heart rate and causes you to sweat) each week. In addition, most adults need muscle-strengthening exercises on 2 or more days a week.   Maintain a healthy weight. The body mass index (BMI) is a screening tool to identify possible weight problems. It provides an estimate of body fat based on height and weight. Your health care provider can find your BMI, and can help you achieve or maintain a healthy weight.For adults 20 years and older:   - A BMI below 18.5 is considered underweight.   - A BMI of 18.5 to 24.9 is normal.   - A BMI of 25 to 29.9 is  considered overweight.   - A BMI of 30 and above is considered obese.   Maintain normal blood lipids and cholesterol levels by exercising and minimizing your intake of trans and saturated fats.  Eat a balanced diet with plenty of fruit and vegetables. Blood tests for lipids and cholesterol should begin at age 20 and be repeated every 5 years minimum.  If your lipid or cholesterol levels are high, you are over 40, or you are at high risk for heart disease, you may need your cholesterol levels checked more frequently.Ongoing high lipid and cholesterol levels should be treated with medicines if diet and exercise are not working.   If you smoke, find out from your health care provider how to quit. If you do not use tobacco, do not start.   Lung cancer screening is recommended for adults aged 55-80 years who are at high risk for developing lung cancer because of a history of smoking. A yearly low-dose CT scan of the lungs is recommended for people who have at least a 30-pack-year history of smoking and are a current smoker or have quit within the past 15 years. A pack year of smoking is smoking an average of 1 pack of cigarettes a day for 1 year (for example: 1 pack a day for 30 years or 2 packs a day for 15 years). Yearly screening should continue until the smoker has stopped smoking for at least 15 years. Yearly screening should be stopped for people who develop a   health problem that would prevent them from having lung cancer treatment.   If you are pregnant, do not drink alcohol. If you are breastfeeding, be very cautious about drinking alcohol. If you are not pregnant and choose to drink alcohol, do not have more than 1 drink per day. One drink is considered to be 12 ounces (355 mL) of beer, 5 ounces (148 mL) of wine, or 1.5 ounces (44 mL) of liquor.   Avoid use of street drugs. Do not share needles with anyone. Ask for help if you need support or instructions about stopping the use of  drugs.   High blood pressure causes heart disease and increases the risk of stroke. Your blood pressure should be checked at least yearly.  Ongoing high blood pressure should be treated with medicines if weight loss and exercise do not work.   If you are 69-55 years old, ask your health care provider if you should take aspirin to prevent strokes.   Diabetes screening involves taking a blood sample to check your fasting blood sugar level. This should be done once every 3 years, after age 38, if you are within normal weight and without risk factors for diabetes. Testing should be considered at a younger age or be carried out more frequently if you are overweight and have at least 1 risk factor for diabetes.   Breast cancer screening is essential preventive care for women. You should practice "breast self-awareness."  This means understanding the normal appearance and feel of your breasts and may include breast self-examination.  Any changes detected, no matter how small, should be reported to a health care provider.  Women in their 80s and 30s should have a clinical breast exam (CBE) by a health care provider as part of a regular health exam every 1 to 3 years.  After age 66, women should have a CBE every year.  Starting at age 1, women should consider having a mammogram (breast X-ray test) every year.  Women who have a family history of breast cancer should talk to their health care provider about genetic screening.  Women at a high risk of breast cancer should talk to their health care providers about having an MRI and a mammogram every year.   -Breast cancer gene (BRCA)-related cancer risk assessment is recommended for women who have family members with BRCA-related cancers. BRCA-related cancers include breast, ovarian, tubal, and peritoneal cancers. Having family members with these cancers may be associated with an increased risk for harmful changes (mutations) in the breast cancer genes BRCA1 and  BRCA2. Results of the assessment will determine the need for genetic counseling and BRCA1 and BRCA2 testing.   The Pap test is a screening test for cervical cancer. A Pap test can show cell changes on the cervix that might become cervical cancer if left untreated. A Pap test is a procedure in which cells are obtained and examined from the lower end of the uterus (cervix).   - Women should have a Pap test starting at age 57.   - Between ages 90 and 70, Pap tests should be repeated every 2 years.   - Beginning at age 63, you should have a Pap test every 3 years as long as the past 3 Pap tests have been normal.   - Some women have medical problems that increase the chance of getting cervical cancer. Talk to your health care provider about these problems. It is especially important to talk to your health care provider if a  new problem develops soon after your last Pap test. In these cases, your health care provider may recommend more frequent screening and Pap tests.   - The above recommendations are the same for women who have or have not gotten the vaccine for human papillomavirus (HPV).   - If you had a hysterectomy for a problem that was not cancer or a condition that could lead to cancer, then you no longer need Pap tests. Even if you no longer need a Pap test, a regular exam is a good idea to make sure no other problems are starting.   - If you are between ages 36 and 66 years, and you have had normal Pap tests going back 10 years, you no longer need Pap tests. Even if you no longer need a Pap test, a regular exam is a good idea to make sure no other problems are starting.   - If you have had past treatment for cervical cancer or a condition that could lead to cancer, you need Pap tests and screening for cancer for at least 20 years after your treatment.   - If Pap tests have been discontinued, risk factors (such as a new sexual partner) need to be reassessed to determine if screening should  be resumed.   - The HPV test is an additional test that may be used for cervical cancer screening. The HPV test looks for the virus that can cause the cell changes on the cervix. The cells collected during the Pap test can be tested for HPV. The HPV test could be used to screen women aged 70 years and older, and should be used in women of any age who have unclear Pap test results. After the age of 67, women should have HPV testing at the same frequency as a Pap test.   Colorectal cancer can be detected and often prevented. Most routine colorectal cancer screening begins at the age of 57 years and continues through age 26 years. However, your health care provider may recommend screening at an earlier age if you have risk factors for colon cancer. On a yearly basis, your health care provider may provide home test kits to check for hidden blood in the stool.  Use of a small camera at the end of a tube, to directly examine the colon (sigmoidoscopy or colonoscopy), can detect the earliest forms of colorectal cancer. Talk to your health care provider about this at age 23, when routine screening begins. Direct exam of the colon should be repeated every 5 -10 years through age 49 years, unless early forms of pre-cancerous polyps or small growths are found.   People who are at an increased risk for hepatitis B should be screened for this virus. You are considered at high risk for hepatitis B if:  -You were born in a country where hepatitis B occurs often. Talk with your health care provider about which countries are considered high risk.  - Your parents were born in a high-risk country and you have not received a shot to protect against hepatitis B (hepatitis B vaccine).  - You have HIV or AIDS.  - You use needles to inject street drugs.  - You live with, or have sex with, someone who has Hepatitis B.  - You get hemodialysis treatment.  - You take certain medicines for conditions like cancer, organ  transplantation, and autoimmune conditions.   Hepatitis C blood testing is recommended for all people born from [birth year] through 1965 and any individual  with known risks for hepatitis C.   Practice safe sex. Use condoms and avoid high-risk sexual practices to reduce the spread of sexually transmitted infections (STIs). STIs include gonorrhea, chlamydia, syphilis, trichomonas, herpes, HPV, and human immunodeficiency virus (HIV). Herpes, HIV, and HPV are viral illnesses that have no cure. They can result in disability, cancer, and death. Sexually active women aged 25 years and younger should be checked for chlamydia. Older women with new or multiple partners should also be tested for chlamydia. Testing for other STIs is recommended if you are sexually active and at increased risk.   Osteoporosis is a disease in which the bones lose minerals and strength with aging. This can result in serious bone fractures or breaks. The risk of osteoporosis can be identified using a bone density scan. Women ages 65 years and over and women at risk for fractures or osteoporosis should discuss screening with their health care providers. Ask your health care provider whether you should take a calcium supplement or vitamin D to There are also several preventive steps women can take to avoid osteoporosis and resulting fractures or to keep osteoporosis from worsening. -->Recommendations include:  Eat a balanced diet high in fruits, vegetables, calcium, and vitamins.  Get enough calcium. The recommended total intake of is 1,200 mg daily; for best absorption, if taking supplements, divide doses into 250-500 mg doses throughout the day. Of the two types of calcium, calcium carbonate is best absorbed when taken with food but calcium citrate can be taken on an empty stomach.  Get enough vitamin D. NAMS and the National Osteoporosis Foundation recommend at least 1,000 IU per day for women age 50 and over who are at risk of vitamin D  deficiency. Vitamin D deficiency can be caused by inadequate sun exposure (for example, those who live in northern latitudes).  Avoid alcohol and smoking. Heavy alcohol intake (more than 7 drinks per week) increases the risk of falls and hip fracture and women smokers tend to lose bone more rapidly and have lower bone mass than nonsmokers. Stopping smoking is one of the most important changes women can make to improve their health and decrease risk for disease.  Be physically active every day. Weight-bearing exercise (for example, fast walking, hiking, jogging, and weight training) may strengthen bones or slow the rate of bone loss that comes with aging. Balancing and muscle-strengthening exercises can reduce the risk of falling and fracture.  Consider therapeutic medications. Currently, several types of effective drugs are available. Healthcare providers can recommend the type most appropriate for each woman.  Eliminate environmental factors that may contribute to accidents. Falls cause nearly 90% of all osteoporotic fractures, so reducing this risk is an important bone-health strategy. Measures include ample lighting, removing obstructions to walking, using nonskid rugs on floors, and placing mats and/or grab bars in showers.  Be aware of medication side effects. Some common medicines make bones weaker. These include a type of steroid drug called glucocorticoids used for arthritis and asthma, some antiseizure drugs, certain sleeping pills, treatments for endometriosis, and some cancer drugs. An overactive thyroid gland or using too much thyroid hormone for an underactive thyroid can also be a problem. If you are taking these medicines, talk to your doctor about what you can do to help protect your bones.reduce the rate of osteoporosis.    Menopause can be associated with physical symptoms and risks. Hormone replacement therapy is available to decrease symptoms and risks. You should talk to your  health care provider   about whether hormone replacement therapy is right for you.   Use sunscreen. Apply sunscreen liberally and repeatedly throughout the day. You should seek shade when your shadow is shorter than you. Protect yourself by wearing long sleeves, pants, a wide-brimmed hat, and sunglasses year round, whenever you are outdoors.   Once a month, do a whole body skin exam, using a mirror to look at the skin on your back. Tell your health care provider of new moles, moles that have irregular borders, moles that are larger than a pencil eraser, or moles that have changed in shape or color.   -Stay current with required vaccines (immunizations).   Influenza vaccine. All adults should be immunized every year.  Tetanus, diphtheria, and acellular pertussis (Td, Tdap) vaccine. Pregnant women should receive 1 dose of Tdap vaccine during each pregnancy. The dose should be obtained regardless of the length of time since the last dose. Immunization is preferred during the 27th 36th week of gestation. An adult who has not previously received Tdap or who does not know her vaccine status should receive 1 dose of Tdap. This initial dose should be followed by tetanus and diphtheria toxoids (Td) booster doses every 10 years. Adults with an unknown or incomplete history of completing a 3-dose immunization series with Td-containing vaccines should begin or complete a primary immunization series including a Tdap dose. Adults should receive a Td booster every 10 years.  Varicella vaccine. An adult without evidence of immunity to varicella should receive 2 doses or a second dose if she has previously received 1 dose. Pregnant females who do not have evidence of immunity should receive the first dose after pregnancy. This first dose should be obtained before leaving the health care facility. The second dose should be obtained 4 8 weeks after the first dose.  Human papillomavirus (HPV) vaccine. Females aged 13 26  years who have not received the vaccine previously should obtain the 3-dose series. The vaccine is not recommended for use in pregnant females. However, pregnancy testing is not needed before receiving a dose. If a female is found to be pregnant after receiving a dose, no treatment is needed. In that case, the remaining doses should be delayed until after the pregnancy. Immunization is recommended for any person with an immunocompromised condition through the age of 26 years if she did not get any or all doses earlier. During the 3-dose series, the second dose should be obtained 4 8 weeks after the first dose. The third dose should be obtained 24 weeks after the first dose and 16 weeks after the second dose.  Zoster vaccine. One dose is recommended for adults aged 60 years or older unless certain conditions are present.  Measles, mumps, and rubella (MMR) vaccine. Adults born before 1957 generally are considered immune to measles and mumps. Adults born in 1957 or later should have 1 or more doses of MMR vaccine unless there is a contraindication to the vaccine or there is laboratory evidence of immunity to each of the three diseases. A routine second dose of MMR vaccine should be obtained at least 28 days after the first dose for students attending postsecondary schools, health care workers, or international travelers. People who received inactivated measles vaccine or an unknown type of measles vaccine during 1963 1967 should receive 2 doses of MMR vaccine. People who received inactivated mumps vaccine or an unknown type of mumps vaccine before 1979 and are at high risk for mumps infection should consider immunization with 2 doses of   MMR vaccine. For females of childbearing age, rubella immunity should be determined. If there is no evidence of immunity, females who are not pregnant should be vaccinated. If there is no evidence of immunity, females who are pregnant should delay immunization until after pregnancy.  Unvaccinated health care workers born before [birth year] who lack laboratory evidence of measles, mumps, or rubella immunity or laboratory confirmation of disease should consider measles and mumps immunization with 2 doses of MMR vaccine or rubella immunization with 1 dose of MMR vaccine.  Pneumococcal 13-valent conjugate (PCV13) vaccine. When indicated, a person who is uncertain of her immunization history and has no record of immunization should receive the PCV13 vaccine. An adult aged 54 years or older who has certain medical conditions and has not been previously immunized should receive 1 dose of PCV13 vaccine. This PCV13 should be followed with a dose of pneumococcal polysaccharide (PPSV23) vaccine. The PPSV23 vaccine dose should be obtained at least 8 weeks after the dose of PCV13 vaccine. An adult aged 58 years or older who has certain medical conditions and previously received 1 or more doses of PPSV23 vaccine should receive 1 dose of PCV13. The PCV13 vaccine dose should be obtained 1 or more years after the last PPSV23 vaccine dose.  Pneumococcal polysaccharide (PPSV23) vaccine. When PCV13 is also indicated, PCV13 should be obtained first. All adults aged 58 years and older should be immunized. An adult younger than age 65 years who has certain medical conditions should be immunized. Any person who resides in a nursing home or long-term care facility should be immunized. An adult smoker should be immunized. People with an immunocompromised condition and certain other conditions should receive both PCV13 and PPSV23 vaccines. People with human immunodeficiency virus (HIV) infection should be immunized as soon as possible after diagnosis. Immunization during chemotherapy or radiation therapy should be avoided. Routine use of PPSV23 vaccine is not recommended for American Indians, [place] Natives, or people younger than 65 years unless there are medical conditions that require PPSV23 vaccine. When indicated,  people who have unknown immunization and have no record of immunization should receive PPSV23 vaccine. One-time revaccination 5 years after the first dose of PPSV23 is recommended for people aged 70 64 years who have chronic kidney failure, nephrotic syndrome, asplenia, or immunocompromised conditions. People who received 1 2 doses of PPSV23 before age 32 years should receive another dose of PPSV23 vaccine at age 96 years or later if at least 5 years have passed since the previous dose. Doses of PPSV23 are not needed for people immunized with PPSV23 at or after age 55 years.  Meningococcal vaccine. Adults with asplenia or persistent complement component deficiencies should receive 2 doses of quadrivalent meningococcal conjugate (MenACWY-D) vaccine. The doses should be obtained at least 2 months apart. Microbiologists working with certain meningococcal bacteria, [employer] recruits, people at risk during an outbreak, and people who travel to or live in countries with a high rate of meningitis should be immunized. A first-year college student up through age 58 years who is living in a residence hall should receive a dose if she did not receive a dose on or after her 16th birthday. Adults who have certain high-risk conditions should receive one or more doses of vaccine.  Hepatitis A vaccine. Adults who wish to be protected from this disease, have certain high-risk conditions, work with hepatitis A-infected animals, work in hepatitis A research labs, or travel to or work in countries with a high rate of hepatitis A should be  immunized. Adults who were previously unvaccinated and who anticipate close contact with an international adoptee during the first 60 days after arrival in the [place] from a country with a high rate of hepatitis A should be immunized.  Hepatitis B vaccine.  Adults who wish to be protected from this disease, have certain high-risk conditions, may be exposed to blood or other infectious  body fluids, are household contacts or sex partners of hepatitis B positive people, are clients or workers in certain care facilities, or travel to or work in countries with a high rate of hepatitis B should be immunized.  Haemophilus influenzae type b (Hib) vaccine. A previously unvaccinated person with asplenia or sickle cell disease or having a scheduled splenectomy should receive 1 dose of Hib vaccine. Regardless of previous immunization, a recipient of a hematopoietic stem cell transplant should receive a 3-dose series 6 12 months after her successful transplant. Hib vaccine is not recommended for adults with HIV infection.  Preventive Services / Frequency Ages 6 to 39years  Blood pressure check.** / Every 1 to 2 years.  Lipid and cholesterol check.** / Every 5 years beginning at age 39.  Clinical breast exam.** / Every 3 years for women in their 61s and 62s.  BRCA-related cancer risk assessment.** / For women who have family members with a BRCA-related cancer (breast, ovarian, tubal, or peritoneal cancers).  Pap test.** / Every 2 years from ages 47 through 85. Every 3 years starting at age 34 through age 12 or 74 with a history of 3 consecutive normal Pap tests.  HPV screening.** / Every 3 years from ages 46 through ages 43 to 54 with a history of 3 consecutive normal Pap tests.  Hepatitis C blood test.** / For any individual with known risks for hepatitis C.  Skin self-exam. / Monthly.  Influenza vaccine. / Every year.  Tetanus, diphtheria, and acellular pertussis (Tdap, Td) vaccine.** / Consult your health care provider. Pregnant women should receive 1 dose of Tdap vaccine during each pregnancy. 1 dose of Td every 10 years.  Varicella vaccine.** / Consult your health care provider. Pregnant females who do not have evidence of immunity should receive the first dose after pregnancy.  HPV vaccine. / 3 doses over 6 months, if 64 and younger. The vaccine is not recommended for use in  pregnant females. However, pregnancy testing is not needed before receiving a dose.  Measles, mumps, rubella (MMR) vaccine.** / You need at least 1 dose of MMR if you were born in 1957 or later. You may also need a 2nd dose. For females of childbearing age, rubella immunity should be determined. If there is no evidence of immunity, females who are not pregnant should be vaccinated. If there is no evidence of immunity, females who are pregnant should delay immunization until after pregnancy.  Pneumococcal 13-valent conjugate (PCV13) vaccine.** / Consult your health care provider.  Pneumococcal polysaccharide (PPSV23) vaccine.** / 1 to 2 doses if you smoke cigarettes or if you have certain conditions.  Meningococcal vaccine.** / 1 dose if you are age 71 to 37 years and a Market researcher living in a residence hall, or have one of several medical conditions, you need to get vaccinated against meningococcal disease. You may also need additional booster doses.  Hepatitis A vaccine.** / Consult your health care provider.  Hepatitis B vaccine.** / Consult your health care provider.  Haemophilus influenzae type b (Hib) vaccine.** / Consult your health care provider.  Ages 55 to 64years  Blood pressure check.** / Every 1 to 2 years.  Lipid and cholesterol check.** / Every 5 years beginning at age 20 years.  Lung cancer screening. / Every year if you are aged 55 80 years and have a 30-pack-year history of smoking and currently smoke or have quit within the past 15 years. Yearly screening is stopped once you have quit smoking for at least 15 years or develop a health problem that would prevent you from having lung cancer treatment.  Clinical breast exam.** / Every year after age 40 years.  BRCA-related cancer risk assessment.** / For women who have family members with a BRCA-related cancer (breast, ovarian, tubal, or peritoneal cancers).  Mammogram.** / Every year beginning at age 40  years and continuing for as long as you are in good health. Consult with your health care provider.  Pap test.** / Every 3 years starting at age 30 years through age 65 or 70 years with a history of 3 consecutive normal Pap tests.  HPV screening.** / Every 3 years from ages 30 years through ages 65 to 70 years with a history of 3 consecutive normal Pap tests.  Fecal occult blood test (FOBT) of stool. / Every year beginning at age 50 years and continuing until age 75 years. You may not need to do this test if you get a colonoscopy every 10 years.  Flexible sigmoidoscopy or colonoscopy.** / Every 5 years for a flexible sigmoidoscopy or every 10 years for a colonoscopy beginning at age 50 years and continuing until age 75 years.  Hepatitis C blood test.** / For all people born from 1945 through 1965 and any individual with known risks for hepatitis C.  Skin self-exam. / Monthly.  Influenza vaccine. / Every year.  Tetanus, diphtheria, and acellular pertussis (Tdap/Td) vaccine.** / Consult your health care provider. Pregnant women should receive 1 dose of Tdap vaccine during each pregnancy. 1 dose of Td every 10 years.  Varicella vaccine.** / Consult your health care provider. Pregnant females who do not have evidence of immunity should receive the first dose after pregnancy.  Zoster vaccine.** / 1 dose for adults aged 60 years or older.  Measles, mumps, rubella (MMR) vaccine.** / You need at least 1 dose of MMR if you were born in 1957 or later. You may also need a 2nd dose. For females of childbearing age, rubella immunity should be determined. If there is no evidence of immunity, females who are not pregnant should be vaccinated. If there is no evidence of immunity, females who are pregnant should delay immunization until after pregnancy.  Pneumococcal 13-valent conjugate (PCV13) vaccine.** / Consult your health care provider.  Pneumococcal polysaccharide (PPSV23) vaccine.** / 1 to 2 doses if  you smoke cigarettes or if you have certain conditions.  Meningococcal vaccine.** / Consult your health care provider.  Hepatitis A vaccine.** / Consult your health care provider.  Hepatitis B vaccine.** / Consult your health care provider.  Haemophilus influenzae type b (Hib) vaccine.** / Consult your health care provider.  Ages 65 years and over  Blood pressure check.** / Every 1 to 2 years.  Lipid and cholesterol check.** / Every 5 years beginning at age 20 years.  Lung cancer screening. / Every year if you are aged 55 80 years and have a 30-pack-year history of smoking and currently smoke or have quit within the past 15 years. Yearly screening is stopped once you have quit smoking for at least 15 years or develop a health problem that   would prevent you from having lung cancer treatment.  Clinical breast exam.** / Every year after age 103 years.  BRCA-related cancer risk assessment.** / For women who have family members with a BRCA-related cancer (breast, ovarian, tubal, or peritoneal cancers).  Mammogram.** / Every year beginning at age 36 years and continuing for as long as you are in good health. Consult with your health care provider.  Pap test.** / Every 3 years starting at age 5 years through age 85 or 10 years with 3 consecutive normal Pap tests. Testing can be stopped between 65 and 70 years with 3 consecutive normal Pap tests and no abnormal Pap or HPV tests in the past 10 years.  HPV screening.** / Every 3 years from ages 93 years through ages 70 or 45 years with a history of 3 consecutive normal Pap tests. Testing can be stopped between 65 and 70 years with 3 consecutive normal Pap tests and no abnormal Pap or HPV tests in the past 10 years.  Fecal occult blood test (FOBT) of stool. / Every year beginning at age 8 years and continuing until age 45 years. You may not need to do this test if you get a colonoscopy every 10 years.  Flexible sigmoidoscopy or colonoscopy.** /  Every 5 years for a flexible sigmoidoscopy or every 10 years for a colonoscopy beginning at age 69 years and continuing until age 68 years.  Hepatitis C blood test.** / For all people born from [birth year] through 1966 and any individual with known risks for hepatitis C.  Osteoporosis screening.** / A one-time screening for women ages 7 years and over and women at risk for fractures or osteoporosis.  Skin self-exam. / Monthly.  Influenza vaccine. / Every year.  Tetanus, diphtheria, and acellular pertussis (Tdap/Td) vaccine.** / 1 dose of Td every 10 years.  Varicella vaccine.** / Consult your health care provider.  Zoster vaccine.** / 1 dose for adults aged 5 years or older.  Pneumococcal 13-valent conjugate (PCV13) vaccine.** / Consult your health care provider.  Pneumococcal polysaccharide (PPSV23) vaccine.** / 1 dose for all adults aged 74 years and older.  Meningococcal vaccine.** / Consult your health care provider.  Hepatitis A vaccine.** / Consult your health care provider.  Hepatitis B vaccine.** / Consult your health care provider.  Haemophilus influenzae type b (Hib) vaccine.** / Consult your health care provider. ** Family history and personal history of risk and conditions may change your health care provider's recommendations. Document Released: 01/02/2002 Document Revised: 08/27/2013  [facility] Patient Information 2014 [place], [place].   EXERCISE AND DIET:  We recommended that you start or continue a regular exercise program for good health. Regular exercise means any activity that makes your heart beat faster and makes you sweat.  We recommend exercising at least 30 minutes per day at least 3 days a week, preferably 5.  We also recommend a diet low in fat and sugar / carbohydrates.  Inactivity, poor dietary choices and obesity can cause diabetes, heart attack, stroke, and kidney damage, among others.     ALCOHOL AND SMOKING:  Women should limit their alcohol intake to no  more than 7 drinks/beers/glasses of wine (combined, not each!) per week. Moderation of alcohol intake to this level decreases your risk of breast cancer and liver damage.  ( And of course, no recreational drugs are part of a healthy lifestyle.)  Also, you should not be smoking at all or even being exposed to second hand smoke. Most people know smoking can  cause cancer, and various heart and lung diseases, but did you know it also contributes to weakening of your bones?  Aging of your skin?  Yellowing of your teeth and nails?   CALCIUM AND VITAMIN D:  Adequate intake of calcium and Vitamin D are recommended.  The recommendations for exact amounts of these supplements seem to change often, but generally speaking 600 mg of calcium (either carbonate or citrate) and 800 units of Vitamin D per day seems prudent. Certain women may benefit from higher intake of Vitamin D.  If you are among these women, your doctor will have told you during your visit.     PAP SMEARS:  Pap smears, to check for cervical cancer or precancers,  have traditionally been done yearly, although recent scientific advances have shown that most women can have pap smears less often.  However, every woman still should have a physical exam from her gynecologist or primary care physician every year. It will include a breast check, inspection of the vulva and vagina to check for abnormal growths or skin changes, a visual exam of the cervix, and then an exam to evaluate the size and shape of the uterus and ovaries.  And after 43 years of age, a rectal exam is indicated to check for rectal cancers. We will also provide age appropriate advice regarding health maintenance, like when you should have certain vaccines, screening for sexually transmitted diseases, bone density testing, colonoscopy, mammograms, etc.    MAMMOGRAMS:  All women over 65 years old should have a yearly mammogram. Many facilities now offer a "3D" mammogram, which may cost  around $50 extra out of pocket. If possible,  we recommend you accept the option to have the 3D mammogram performed.  It both reduces the number of women who will be called back for extra views which then turn out to be normal, and it is better than the routine mammogram at detecting truly abnormal areas.     COLONOSCOPY:  Colonoscopy to screen for colon cancer is recommended for all women at age 26.  We know, you hate the idea of the prep.  We agree, BUT, having colon cancer and not knowing it is worse!!  Colon cancer so often starts as a polyp that can be seen and removed at colonscopy, which can quite literally save your life!  And if your first colonoscopy is normal and you have no family history of colon cancer, most women don't have to have it again for 10 years.  Once every ten years, you can do something that may end up saving your life, right?  We will be happy to help you get it scheduled when you are ready.  Be sure to check your insurance coverage so you understand how much it will cost.  It may be covered as a preventative service at no cost, but you should check your particular policy.    Mediterranean Diet A Mediterranean diet refers to food and lifestyle choices that are based on the traditions of countries located on the [employer]. This way of eating has been shown to help prevent certain conditions and improve outcomes for people who have chronic diseases, like kidney disease and heart disease. What are tips for following this plan? Lifestyle  Cook and eat meals together with your family, when possible.  Drink enough fluid to keep your urine clear or pale yellow.  Be physically active every day. This includes: ? Aerobic exercise like running or swimming. ? Leisure activities like  gardening, walking, or housework.  Get 7-8 hours of sleep each night.  If recommended by your health care provider, drink red wine in moderation. This means 1 glass a day for nonpregnant women  and 2 glasses a day for men. A glass of wine equals 5 oz (150 mL). Reading food labels   Check the serving size of packaged foods. For foods such as rice and pasta, the serving size refers to the amount of cooked product, not dry.  Check the total fat in packaged foods. Avoid foods that have saturated fat or trans fats.  Check the ingredients list for added sugars, such as corn syrup. Shopping  At the grocery store, buy most of your food from the areas near the walls of the store. This includes: ? Fresh fruits and vegetables (produce). ? Grains, beans, nuts, and seeds. Some of these may be available in unpackaged forms or large amounts (in bulk). ? Fresh seafood. ? Poultry and eggs. ? Low-fat dairy products.  Buy whole ingredients instead of prepackaged foods.  Buy fresh fruits and vegetables in-season from local farmers markets.  Buy frozen fruits and vegetables in resealable bags.  If you do not have access to quality fresh seafood, buy precooked frozen shrimp or canned fish, such as tuna, salmon, or sardines.  Buy small amounts of raw or cooked vegetables, salads, or olives from the deli or salad bar at your store.  Stock your pantry so you always have certain foods on hand, such as olive oil, canned tuna, canned tomatoes, rice, pasta, and beans. Cooking  Cook foods with extra-virgin olive oil instead of using butter or other vegetable oils.  Have meat as a side dish, and have vegetables or grains as your main dish. This means having meat in small portions or adding small amounts of meat to foods like pasta or stew.  Use beans or vegetables instead of meat in common dishes like chili or lasagna.  Experiment with different cooking methods. Try roasting or broiling vegetables instead of steaming or sauteing them.  Add frozen vegetables to soups, stews, pasta, or rice.  Add nuts or seeds for added healthy fat at each meal. You can add these to yogurt, salads, or vegetable  dishes.  Marinate fish or vegetables using olive oil, lemon juice, garlic, and fresh herbs. Meal planning   Plan to eat 1 vegetarian meal one day each week. Try to work up to 2 vegetarian meals, if possible.  Eat seafood 2 or more times a week.  Have healthy snacks readily available, such as: ? Vegetable sticks with hummus. ? [place] yogurt. ? Fruit and nut trail mix.  Eat balanced meals throughout the week. This includes: ? Fruit: 2-3 servings a day ? Vegetables: 4-5 servings a day ? Low-fat dairy: 2 servings a day ? Fish, poultry, or lean meat: 1 serving a day ? Beans and legumes: 2 or more servings a week ? Nuts and seeds: 1-2 servings a day ? Whole grains: 6-8 servings a day ? Extra-virgin olive oil: 3-4 servings a day  Limit red meat and sweets to only a few servings a month What are my food choices?  Mediterranean diet ? Recommended ? Grains: Whole-grain pasta. Brown rice. Bulgar wheat. Polenta. Couscous. Whole-wheat bread. [place]. ? Vegetables: Artichokes. Beets. Broccoli. Cabbage. Carrots. Eggplant. Green beans. Chard. Kale. Spinach. Onions. Leeks. Peas. Squash. Tomatoes. Peppers. Radishes. ? Fruits: Apples. Apricots. Avocado. Berries. Bananas. Cherries. Dates. Figs. Grapes. Lemons. Melon. Oranges. Peaches. Plums. Pomegranate. ? Meats and  other protein foods: Beans. Almonds. Sunflower seeds. Pine nuts. Peanuts. [place]. Salmon. Scallops. Shrimp. [place]. Tilapia. Clams. Oysters. Eggs. ? Dairy: Low-fat milk. Cheese. Greek yogurt. ? Beverages: Water. Red wine. Herbal tea. ? Fats and oils: Extra virgin olive oil. Avocado oil. Grape seed oil. ? Sweets and desserts: [place] yogurt with honey. Baked apples. Poached pears. Trail mix. ? Seasoning and other foods: Basil. Cilantro. Coriander. Cumin. Mint. Parsley. Sage. Rosemary. Tarragon. Garlic. Oregano. Thyme. Pepper. Balsalmic vinegar. Tahini. Hummus. Tomato sauce. Olives. Mushrooms. ? Limit these ? Grains: Prepackaged pasta  or rice dishes. Prepackaged cereal with added sugar. ? Vegetables: Deep fried potatoes (french fries). ? Fruits: Fruit canned in syrup. ? Meats and other protein foods: Beef. Pork. Lamb. Poultry with skin. Hot dogs. [place] Salines. ? Dairy: Ice cream. Sour cream. Whole milk. ? Beverages: Juice. Sugar-sweetened soft drinks. Beer. Liquor and spirits. ? Fats and oils: Butter. Canola oil. Vegetable oil. Beef fat (tallow). Lard. ? Sweets and desserts: Cookies. Cakes. Pies. Candy. ? Seasoning and other foods: Mayonnaise. Premade sauces and marinades. ? The items listed may not be a complete list. Talk with your dietitian about what dietary choices are right for you. Summary  The Mediterranean diet includes both food and lifestyle choices.  Eat a variety of fresh fruits and vegetables, beans, nuts, seeds, and whole grains.  Limit the amount of red meat and sweets that you eat.  Talk with your health care provider about whether it is safe for you to drink red wine in moderation. This means 1 glass a day for nonpregnant women and 2 glasses a day for men. A glass of wine equals 5 oz (150 mL). This information is not intended to replace advice given to you by your health care provider. Make sure you discuss any questions you have with your health care provider. Document Released: 06/29/2016 Document Revised: 08/01/2016 Document Reviewed: 06/29/2016 Elsevier Interactive Patient Education  2019 [place] regular walking. Drink plenty of water and follow Mediterranean Diet. We will call you when lab results are available. Please use Lorazepam for occasional insomnia, okay for one refill until annual follow-up. Overall you are doing a great job with your health- 7 lb weight loss since Sept! Recommend annual physical with fasting labs. GREAT TO SEE YOU!

## 2018-12-05 NOTE — Assessment & Plan Note (Signed)
[place] Controlled Substance Database reviewed- no aberrancies noted Please use Lorazepam for occasional insomnia, okay for one refill until annual follow-up.

## 2018-12-05 NOTE — Assessment & Plan Note (Signed)
Chronic Completed course of PT Meloxicam PT Continues to walk 3-4 times/week Has been losing weight- great!

## 2018-12-05 NOTE — Assessment & Plan Note (Signed)
Continue regular walking. Drink plenty of water and follow Mediterranean Diet. We will call you when lab results are available. Please use Lorazepam for occasional insomnia, okay for one refill until annual follow-up. Overall you are doing a great job with your health- 7 lb weight loss since Sept! Recommend annual physical with fasting labs.

## 2018-12-06 LAB — COMPREHENSIVE METABOLIC PANEL
A/G RATIO: 2.1 (ref 1.2–2.2)
ALT: 10 IU/L (ref 0–32)
AST: 11 IU/L (ref 0–40)
Albumin: 4.4 g/dL (ref 3.5–5.5)
Alkaline Phosphatase: 32 IU/L — ABNORMAL LOW (ref 39–117)
BILIRUBIN TOTAL: 0.4 mg/dL (ref 0.0–1.2)
BUN/Creatinine Ratio: 19 (ref 9–23)
BUN: 15 mg/dL (ref 6–24)
CHLORIDE: 105 mmol/L (ref 96–106)
CO2: 24 mmol/L (ref 20–29)
Calcium: 9.1 mg/dL (ref 8.7–10.2)
Creatinine, Ser: 0.81 mg/dL (ref 0.57–1.00)
GFR calc non Af Amer: 89 mL/min/{1.73_m2} (ref >59)
GFR, EST AFRICAN AMERICAN: 102 mL/min/{1.73_m2} (ref >59)
GLUCOSE: 90 mg/dL (ref 65–99)
Globulin, Total: 2.1 g/dL (ref 1.5–4.5)
POTASSIUM: 4.3 mmol/L (ref 3.5–5.2)
Sodium: 142 mmol/L (ref 134–144)
TOTAL PROTEIN: 6.5 g/dL (ref 6.0–8.5)

## 2018-12-06 LAB — CBC WITH DIFFERENTIAL/PLATELET
BASOS ABS: 0 10*3/uL (ref 0.0–0.2)
Basos: 1 %
EOS (ABSOLUTE): 0.1 10*3/uL (ref 0.0–0.4)
Eos: 1 %
Hematocrit: 37.3 % (ref 34.0–46.6)
Hemoglobin: 12.6 g/dL (ref 11.1–15.9)
IMMATURE GRANS (ABS): 0 10*3/uL (ref 0.0–0.1)
Immature Granulocytes: 0 %
LYMPHS: 24 %
Lymphocytes Absolute: 1.4 10*3/uL (ref 0.7–3.1)
MCH: 30.8 pg (ref 26.6–33.0)
MCHC: 33.8 g/dL (ref 31.5–35.7)
MCV: 91 fL (ref 79–97)
MONOS ABS: 0.4 10*3/uL (ref 0.1–0.9)
Monocytes: 7 %
NEUTROS PCT: 67 %
Neutrophils Absolute: 3.7 10*3/uL (ref 1.4–7.0)
PLATELETS: 225 10*3/uL (ref 150–450)
RBC: 4.09 x10E6/uL (ref 3.77–5.28)
RDW: 11.8 % (ref 11.7–15.4)
WBC: 5.6 10*3/uL (ref 3.4–10.8)

## 2018-12-06 LAB — LIPID PANEL
CHOLESTEROL TOTAL: 221 mg/dL — AB (ref 100–199)
Chol/HDL Ratio: 2.8 ratio (ref 0.0–4.4)
HDL: 80 mg/dL (ref >39)
LDL Calculated: 130 mg/dL — ABNORMAL HIGH (ref 0–99)
TRIGLYCERIDES: 53 mg/dL (ref 0–149)
VLDL Cholesterol Cal: 11 mg/dL (ref 5–40)

## 2018-12-06 LAB — HEMOGLOBIN A1C
ESTIMATED AVERAGE GLUCOSE: 97 mg/dL
HEMOGLOBIN A1C: 5 % (ref 4.8–5.6)

## 2018-12-06 LAB — TSH: TSH: 2.02 u[IU]/mL (ref 0.450–4.500)

## 2018-12-11 NOTE — Progress Notes (Signed)
Opened in error. T. Nelson, CMA 

## 2019-04-08 DIAGNOSIS — Z124 Encounter for screening for malignant neoplasm of cervix: Secondary | ICD-10-CM | POA: Diagnosis not present

## 2019-04-08 DIAGNOSIS — Z1231 Encounter for screening mammogram for malignant neoplasm of breast: Secondary | ICD-10-CM | POA: Diagnosis not present

## 2019-04-08 DIAGNOSIS — Z01419 Encounter for gynecological examination (general) (routine) without abnormal findings: Secondary | ICD-10-CM | POA: Diagnosis not present

## 2019-04-09 LAB — HM MAMMOGRAPHY

## 2019-04-10 ENCOUNTER — Other Ambulatory Visit: Payer: Self-pay | Admitting: Obstetrics & Gynecology

## 2019-04-10 DIAGNOSIS — R928 Other abnormal and inconclusive findings on diagnostic imaging of breast: Secondary | ICD-10-CM

## 2019-04-23 ENCOUNTER — Other Ambulatory Visit: Payer: Self-pay

## 2019-04-23 ENCOUNTER — Ambulatory Visit: Payer: 59

## 2019-04-23 ENCOUNTER — Other Ambulatory Visit: Payer: Self-pay | Admitting: Obstetrics & Gynecology

## 2019-04-23 ENCOUNTER — Ambulatory Visit
Admission: RE | Admit: 2019-04-23 | Discharge: 2019-04-23 | Disposition: A | Discharge status: Home / Self Care | Payer: 59 | Source: Ambulatory Visit | Attending: Obstetrics & Gynecology | Admitting: Obstetrics & Gynecology

## 2019-04-23 DIAGNOSIS — R922 Inconclusive mammogram: Secondary | ICD-10-CM | POA: Diagnosis not present

## 2019-04-23 DIAGNOSIS — N6310 Unspecified lump in the right breast, unspecified quadrant: Secondary | ICD-10-CM

## 2019-04-23 DIAGNOSIS — N6311 Unspecified lump in the right breast, upper outer quadrant: Secondary | ICD-10-CM | POA: Diagnosis not present

## 2019-04-23 DIAGNOSIS — N6313 Unspecified lump in the right breast, lower outer quadrant: Secondary | ICD-10-CM | POA: Diagnosis not present

## 2019-04-23 DIAGNOSIS — R928 Other abnormal and inconclusive findings on diagnostic imaging of breast: Secondary | ICD-10-CM

## 2019-04-23 DIAGNOSIS — N6312 Unspecified lump in the right breast, upper inner quadrant: Secondary | ICD-10-CM | POA: Diagnosis not present

## 2019-05-06 DIAGNOSIS — N3941 Urge incontinence: Secondary | ICD-10-CM | POA: Diagnosis not present

## 2019-05-06 DIAGNOSIS — Z6826 Body mass index (BMI) 26.0-26.9, adult: Secondary | ICD-10-CM | POA: Diagnosis not present

## 2019-05-28 ENCOUNTER — Ambulatory Visit (INDEPENDENT_AMBULATORY_CARE_PROVIDER_SITE_OTHER): Payer: 59 | Admitting: Adult Health

## 2019-05-28 ENCOUNTER — Encounter: Payer: Self-pay | Admitting: Adult Health

## 2019-05-28 ENCOUNTER — Other Ambulatory Visit: Payer: Self-pay

## 2019-05-28 DIAGNOSIS — Z0000 Encounter for general adult medical examination without abnormal findings: Secondary | ICD-10-CM | POA: Diagnosis not present

## 2019-05-28 DIAGNOSIS — B351 Tinea unguium: Secondary | ICD-10-CM

## 2019-05-28 MED ORDER — MELOXICAM 7.5 MG PO TABS: 7.5000 mg | ORAL_TABLET | Freq: Two times a day (BID) | ORAL | 0 refills | Status: DC

## 2019-05-28 MED ORDER — LORAZEPAM 0.5 MG PO TABS: ORAL_TABLET | ORAL | 0 refills | Status: DC

## 2019-05-28 MED ORDER — CICLOPIROX 8 % EX SOLN: Freq: Every day | CUTANEOUS | 0 refills | Status: DC

## 2019-05-28 NOTE — Assessment & Plan Note (Signed)
Continue all other medications as directed. Continue to social distance and wear a mask when out in public.

## 2019-05-28 NOTE — Patient Instructions (Signed)
Fungal Nail Infection A fungal nail infection is a common infection of the toenails or fingernails. This condition affects toenails more often than fingernails. It often affects the great, or big, toes. More than one nail may be infected. The condition can be passed from person to person (is contagious). What are the causes? This condition is caused by a fungus. Several types of fungi can cause the infection. These fungi are common in moist and warm areas. If your hands or feet come into contact with the fungus, it may get into a crack in your fingernail or toenail and cause the infection. What increases the risk? The following factors may make you more likely to develop this condition:  Being female.  Being of older age.  Living with someone who has the fungus.  Walking barefoot in areas where the fungus thrives, such as showers or locker rooms.  Wearing shoes and socks that cause your feet to sweat.  Having a nail injury or a recent nail surgery.  Having certain medical conditions, such as: ? Athlete's foot. ? Diabetes. ? Psoriasis. ? Poor circulation. ? A weak body defense system (immune system). What are the signs or symptoms? Symptoms of this condition include:  A pale spot on the nail.  Thickening of the nail.  A nail that becomes yellow or brown.  A brittle or ragged nail edge.  A crumbling nail.  A nail that has lifted away from the nail bed. How is this diagnosed? This condition is diagnosed with a physical exam. Your health care provider may take a scraping or clipping from your nail to test for the fungus. How is this treated? Treatment is not needed for mild infections. If you have significant nail changes, treatment may include:  Antifungal medicines taken by mouth (orally). You may need to take the medicine for several weeks or several months, and you may not see the results for a long time. These medicines can cause side effects. Ask your health care provider  what problems to watch for.  Antifungal nail polish or nail cream. These may be used along with oral antifungal medicines.  Laser treatment of the nail.  Surgery to remove the nail. This may be needed for the most severe infections. It can take a long time, usually up to a year, for the infection to go away. The infection may also come back. Follow these instructions at home: Medicines  Take or apply over-the-counter and prescription medicines only as told by your health care provider.  Ask your health care provider about using over-the-counter mentholated ointment on your nails. Nail care  Trim your nails often.  Wash and dry your hands and feet every day.  Keep your feet dry: ? Wear absorbent socks, and change your socks frequently. ? Wear shoes that allow air to circulate, such as sandals or canvas tennis shoes. Throw out old shoes.  Do not use artificial nails.  If you go to a nail salon, make sure you choose one that uses clean instruments.  Use antifungal foot powder on your feet and in your shoes. General instructions  Do not share personal items, such as towels or nail clippers.  Do not walk barefoot in shower rooms or locker rooms.  Wear rubber gloves if you are working with your hands in wet areas.  Keep all follow-up visits as told by your health care provider. This is important. Contact a health care provider if: Your infection is not getting better or it is getting worse   after several months. Summary  A fungal nail infection is a common infection of the toenails or fingernails.  Treatment is not needed for mild infections. If you have significant nail changes, treatment may include taking medicine orally and applying medicine to your nails.  It can take a long time, usually up to a year, for the infection to go away. The infection may also come back.  Take or apply over-the-counter and prescription medicines only as told by your health care provider.   Follow instructions for taking care of your nails to help prevent infection from coming back or spreading. This information is not intended to replace advice given to you by your health care provider. Make sure you discuss any questions you have with your health care provider. Document Released: 11/03/2000 Document Revised: 02/27/2019 Document Reviewed: 04/12/2018 Elsevier Patient Education  [place].  Please use the Ciclopirox topical solution as directed. If symptoms do not improve in 3 weeks or worsen, please call clinic.  Continue all other medications as directed. Continue to social distance and wear a mask when out in public. GREAT TO SEE YOU!

## 2019-05-28 NOTE — Assessment & Plan Note (Signed)
Please use the Ciclopirox topical solution as directed. If symptoms do not improve in 3 weeks or worsen, please call clinic.

## 2019-05-28 NOTE — Progress Notes (Signed)
Subjective:    Patient ID: Krista Graham, female    DOB: 01-Sep-1976, 43 y.o.   MRN: [id number]  HPI:  Krista Graham presents L 2nd toenail thickness and "crustiness" that developed >1.5 weeks ago. She has been at her backyard pool frequently the last 6 weeks. The last pedicure she had was >10 months ago. She denies pain at rest and reports mild "pressure feeling" when the touch is palpated. She denies trauma to the toenail. She denies hx of onychomycosis. She has not used any OTC remedies   Patient Care Team    Relationship Specialty Notifications Start End  Mina Marble D, NP PCP - General Family Medicine  07/30/17   Ob/Gyn, Esmond Plants    07/30/17     Patient Active Problem List   Diagnosis Date Noted  . Onychomycosis 05/28/2019  . Insomnia 12/05/2018  . Acute pain of left knee 08/07/2018  . Hx of basal cell carcinoma 07/30/2017  . Healthcare maintenance 07/30/2017  . SKIN LESION 09/23/2008  . KNEE PAIN, RIGHT 09/03/2008     Past Medical History:  Diagnosis Date  . Cancer [facility]) 2013   basal cell carcinoma     Past Surgical History:  Procedure Laterality Date  . CESAREAN SECTION    . TONSILLECTOMY       Family History  Problem Relation Age of Onset  . Hypothyroidism Mother   . Healthy Daughter   . Healthy Son   . Diabetes Maternal Grandmother   . Heart disease Maternal Grandmother   . Heart disease Maternal Grandfather   . Hypertension Maternal Grandfather   . COPD Maternal Grandfather   . Healthy Son      Social History   Substance and Sexual Activity  Drug Use No     Social History   Substance and Sexual Activity  Alcohol Use Yes   Comment: socially     Social History   Tobacco Use  Smoking Status Never Smoker  Smokeless Tobacco Never Used     Outpatient Encounter Medications as of 05/28/2019  Medication Sig  . ibuprofen (ADVIL,MOTRIN) 200 MG tablet Take 200 mg by mouth every 6 (six) hours as needed.  Marland Kitchen LORazepam (ATIVAN) 0.5 MG tablet  1/2 to 1 tablet as needed at bedtime for anxiety/insomnia  . meloxicam (MOBIC) 7.5 MG tablet Take 1 tablet (7.5 mg total) by mouth 2 (two) times daily.  Marland Kitchen oxybutynin (DITROPAN-XL) 10 MG 24 hr tablet Take 1 tablet by mouth daily.  . [DISCONTINUED] LORazepam (ATIVAN) 0.5 MG tablet 1/2 to 1 tablet as needed at bedtime for anxiety/insomnia  . [DISCONTINUED] meloxicam (MOBIC) 7.5 MG tablet Take 1 tablet (7.5 mg total) by mouth 2 (two) times daily.  . ciclopirox (PENLAC) 8 % solution Apply topically at bedtime. Apply over nail and surrounding skin. Apply daily over previous coat. After seven (7) days, may remove with alcohol and continue cycle.   No facility-administered encounter medications on file as of 05/28/2019.     Allergies: Patient has no known allergies.  Body mass index is 26.74 kg/m.  Blood pressure 121/72, pulse (!) 103, temperature 98.3 F (36.8 C), temperature source Oral, height 5' 7.25" (1.708 m), weight 172 lb (78 kg), last menstrual period 05/22/2019, SpO2 100 %.  Review of Systems  Constitutional: Positive for fatigue. Negative for activity change, appetite change, chills, diaphoresis, fever and unexpected weight change.  Respiratory: Negative for cough, chest tightness, shortness of breath, wheezing and stridor.   Cardiovascular: Negative for chest pain, palpitations and  leg swelling.  Endocrine: Negative for cold intolerance, heat intolerance, polydipsia, polyphagia and polyuria.  Skin: Positive for color change. Negative for pallor, rash and wound.  Neurological: Negative for dizziness and headaches.  Hematological: Negative for adenopathy. Does not bruise/bleed easily.       Objective:   Physical Exam Vitals signs and nursing note reviewed.  Constitutional:      General: She is not in acute distress.    Appearance: Normal appearance. She is normal weight. She is not ill-appearing, toxic-appearing or diaphoretic.  Skin:    General: Skin is warm and dry.      Capillary Refill: Capillary refill takes less than 2 seconds.     Nails: There is no clubbing.      Comments: L 2nd toenail- yellowing, thickened nail with nail lifting from bed  Neurological:     Mental Status: She is alert and oriented to person, place, and time.  Psychiatric:        Mood and Affect: Mood normal.        Behavior: Behavior normal.        Thought Content: Thought content normal.        Judgment: Judgment normal.       Assessment & Plan:   1. Healthcare maintenance   2. Onychomycosis     Healthcare maintenance Continue all other medications as directed. Continue to social distance and wear a mask when out in public.  Onychomycosis Please use the Ciclopirox topical solution as directed. If symptoms do not improve in 3 weeks or worsen, please call clinic.     FOLLOW-UP:  Return if symptoms worsen or fail to improve.

## 2019-07-30 ENCOUNTER — Ambulatory Visit: Payer: 59 | Admitting: Adult Health

## 2019-10-24 ENCOUNTER — Other Ambulatory Visit: Payer: Self-pay | Admitting: Obstetrics & Gynecology

## 2019-10-24 ENCOUNTER — Ambulatory Visit
Admission: RE | Admit: 2019-10-24 | Discharge: 2019-10-24 | Disposition: A | Discharge status: Home / Self Care | Payer: 59 | Source: Ambulatory Visit | Attending: Obstetrics & Gynecology | Admitting: Obstetrics & Gynecology

## 2019-10-24 ENCOUNTER — Other Ambulatory Visit: Payer: Self-pay

## 2019-10-24 DIAGNOSIS — N6310 Unspecified lump in the right breast, unspecified quadrant: Secondary | ICD-10-CM

## 2019-10-24 DIAGNOSIS — N6011 Diffuse cystic mastopathy of right breast: Secondary | ICD-10-CM | POA: Diagnosis not present

## 2019-10-24 DIAGNOSIS — R922 Inconclusive mammogram: Secondary | ICD-10-CM | POA: Diagnosis not present

## 2019-12-02 ENCOUNTER — Other Ambulatory Visit: Payer: Self-pay

## 2019-12-02 DIAGNOSIS — Z0000 Encounter for general adult medical examination without abnormal findings: Secondary | ICD-10-CM

## 2019-12-03 ENCOUNTER — Other Ambulatory Visit: Payer: Self-pay | Admitting: Adult Health

## 2019-12-03 ENCOUNTER — Other Ambulatory Visit: Payer: No Typology Code available for payment source

## 2019-12-03 ENCOUNTER — Other Ambulatory Visit: Payer: Self-pay

## 2019-12-03 DIAGNOSIS — Z0000 Encounter for general adult medical examination without abnormal findings: Secondary | ICD-10-CM

## 2019-12-04 LAB — CBC WITH DIFFERENTIAL/PLATELET
Basophils Absolute: 0 10*3/uL (ref 0.0–0.2)
Basos: 1 %
EOS (ABSOLUTE): 0.1 10*3/uL (ref 0.0–0.4)
Eos: 1 %
Hematocrit: 40 % (ref 34.0–46.6)
Hemoglobin: 13.5 g/dL (ref 11.1–15.9)
Immature Grans (Abs): 0 10*3/uL (ref 0.0–0.1)
Immature Granulocytes: 0 %
Lymphocytes Absolute: 2.5 10*3/uL (ref 0.7–3.1)
Lymphs: 45 %
MCH: 30.3 pg (ref 26.6–33.0)
MCHC: 33.8 g/dL (ref 31.5–35.7)
MCV: 90 fL (ref 79–97)
Monocytes Absolute: 0.4 10*3/uL (ref 0.1–0.9)
Monocytes: 7 %
Neutrophils Absolute: 2.5 10*3/uL (ref 1.4–7.0)
Neutrophils: 46 %
Platelets: 262 10*3/uL (ref 150–450)
RBC: 4.46 x10E6/uL (ref 3.77–5.28)
RDW: 12.3 % (ref 11.7–15.4)
WBC: 5.5 10*3/uL (ref 3.4–10.8)

## 2019-12-04 LAB — COMPREHENSIVE METABOLIC PANEL
ALT: 13 IU/L (ref 0–32)
AST: 15 IU/L (ref 0–40)
Albumin/Globulin Ratio: 2 (ref 1.2–2.2)
Albumin: 4.7 g/dL (ref 3.8–4.8)
Alkaline Phosphatase: 35 IU/L — ABNORMAL LOW (ref 39–117)
BUN/Creatinine Ratio: 12 (ref 9–23)
BUN: 10 mg/dL (ref 6–24)
Bilirubin Total: 0.5 mg/dL (ref 0.0–1.2)
CO2: 21 mmol/L (ref 20–29)
Calcium: 9.3 mg/dL (ref 8.7–10.2)
Chloride: 105 mmol/L (ref 96–106)
Creatinine, Ser: 0.82 mg/dL (ref 0.57–1.00)
GFR calc Af Amer: 100 mL/min/{1.73_m2} (ref >59)
GFR calc non Af Amer: 87 mL/min/{1.73_m2} (ref >59)
Globulin, Total: 2.4 g/dL (ref 1.5–4.5)
Glucose: 100 mg/dL — ABNORMAL HIGH (ref 65–99)
Potassium: 4.6 mmol/L (ref 3.5–5.2)
Sodium: 139 mmol/L (ref 134–144)
Total Protein: 7.1 g/dL (ref 6.0–8.5)

## 2019-12-04 LAB — HEMOGLOBIN A1C
Est. average glucose Bld gHb Est-mCnc: 91 mg/dL
Hgb A1c MFr Bld: 4.8 % (ref 4.8–5.6)

## 2019-12-04 LAB — LIPID PANEL
Chol/HDL Ratio: 2.8 ratio (ref 0.0–4.4)
Cholesterol, Total: 256 mg/dL — ABNORMAL HIGH (ref 100–199)
HDL: 93 mg/dL (ref >39)
LDL Chol Calc (NIH): 153 mg/dL — ABNORMAL HIGH (ref 0–99)
Triglycerides: 62 mg/dL (ref 0–149)
VLDL Cholesterol Cal: 10 mg/dL (ref 5–40)

## 2019-12-04 LAB — TSH: TSH: 1.91 u[IU]/mL (ref 0.450–4.500)

## 2019-12-09 NOTE — Progress Notes (Signed)
Subjective:    Patient ID: Krista Graham, female    DOB: 1976/09/03, 44 y.o.   MRN: [id number]  HPI:  Krista Graham is here for CPE. Krista Graham walks 3-5 x per week- 3-5 miles each walk. She reports eating red meat several times per week, uses butter when cooking- however has lost >10 lbs since last OV. Current wt 162 Body mass index is 25.12 kg/m. She denies acute complaints today. 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  12/03/2019 Labs: My Notes- CBC-stable CMP-stable A1c-4.8 TSH-1.910 The 10-year ASCVD risk score Mikey Bussing DC Jr., et al., 2013) is: 0.5%   Values used to calculate the score:     Age: 89 years     Sex: Female     Is Non-Hispanic African American: No     Diabetic: No     Tobacco smoker: No     Systolic Blood Pressure: 0000000 mmHg     Is BP treated: No     HDL Cholesterol: 93 mg/dL     Total Cholesterol: 256 mg/dL  LDL-153, last year 130 Healthcare Maintenance: PAP-03/2019-normal per pt- will track down, last pap in system 08/2017-normal Mammogram-10/24/2019- repeat imaging due 04/2020 Immunizations-UTD  Patient Care Team    Relationship Specialty Notifications Start End  Mina Marble D, NP PCP - General Family Medicine  07/30/17   Ob/Gyn, Esmond Plants    07/30/17     Patient Active Problem List   Diagnosis Date Noted  . Hyperlipemia 12/10/2019  . Onychomycosis 05/28/2019  . Insomnia 12/05/2018  . Acute pain of left knee 08/07/2018  . Hx of basal cell carcinoma 07/30/2017  . Healthcare maintenance 07/30/2017  . SKIN LESION 09/23/2008  . KNEE PAIN, RIGHT 09/03/2008     Past Medical History:  Diagnosis Date  . Cancer [facility]) 2013   basal cell carcinoma     Past Surgical History:  Procedure Laterality Date  . CESAREAN SECTION    . TONSILLECTOMY       Family History  Problem Relation Age of Onset  . Hypothyroidism Mother   . Healthy Daughter   . Healthy Son   . Diabetes Maternal Grandmother   . Heart disease Maternal Grandmother   . Heart disease Maternal  Grandfather   . Hypertension Maternal Grandfather   . COPD Maternal Grandfather   . Healthy Son      Social History   Substance and Sexual Activity  Drug Use No     Social History   Substance and Sexual Activity  Alcohol Use Yes   Comment: socially     Social History   Tobacco Use  Smoking Status Never Smoker  Smokeless Tobacco Never Used     Outpatient Encounter Medications as of 12/10/2019  Medication Sig  . ibuprofen (ADVIL,MOTRIN) 200 MG tablet Take 200 mg by mouth every 6 (six) hours as needed.  Marland Kitchen oxybutynin (DITROPAN-XL) 10 MG 24 hr tablet Take 1 tablet by mouth daily.  . [DISCONTINUED] ciclopirox (PENLAC) 8 % solution Apply topically at bedtime. Apply over nail and surrounding skin. Apply daily over previous coat. After seven (7) days, may remove with alcohol and continue cycle.  . [DISCONTINUED] LORazepam (ATIVAN) 0.5 MG tablet 1/2 to 1 tablet as needed at bedtime for anxiety/insomnia  . [DISCONTINUED] meloxicam (MOBIC) 7.5 MG tablet Take 1 tablet (7.5 mg total) by mouth 2 (two) times daily.   No facility-administered encounter medications on file as of 12/10/2019.    Allergies: Patient has no known allergies.  Body mass index  is 25.12 kg/m.  Blood pressure 125/73, pulse 75, temperature 98.7 F (37.1 C), temperature source Oral, height 5' 7.5" (1.715 m), weight 162 lb 12.8 oz (73.8 kg), last menstrual period 12/09/2019, SpO2 100 %.  Review of Systems  Constitutional: Positive for fatigue. Negative for activity change, appetite change, chills, diaphoresis, fever and unexpected weight change.  HENT: Negative for congestion.   Eyes: Negative for visual disturbance.  Respiratory: Negative for cough, chest tightness, shortness of breath, wheezing and stridor.   Cardiovascular: Negative for chest pain, palpitations and leg swelling.  Gastrointestinal: Negative for abdominal distention, abdominal pain, blood in stool, constipation, diarrhea, nausea and vomiting.   Endocrine: Negative for polydipsia, polyphagia and polyuria.  Musculoskeletal: Negative for arthralgias, back pain, gait problem, joint swelling, myalgias, neck pain and neck stiffness.  Neurological: Negative for dizziness.  Hematological: Negative for adenopathy. Does not bruise/bleed easily.  Psychiatric/Behavioral: Negative for agitation, behavioral problems, confusion, decreased concentration, dysphoric mood, hallucinations, self-injury, sleep disturbance and suicidal ideas. The patient is not nervous/anxious and is not hyperactive.        Objective:   Physical Exam Vitals and nursing note reviewed.  Constitutional:      General: She is not in acute distress.    Appearance: Normal appearance. She is not ill-appearing, toxic-appearing or diaphoretic.  HENT:     Head: Normocephalic and atraumatic.     Right Ear: Tympanic membrane, ear canal and external ear normal. There is no impacted cerumen.     Left Ear: Tympanic membrane, ear canal and external ear normal. There is no impacted cerumen.     Nose: Nose normal. No congestion.  Eyes:     Extraocular Movements: Extraocular movements intact.     Conjunctiva/sclera: Conjunctivae normal.     Pupils: Pupils are equal, round, and reactive to light.  Cardiovascular:     Rate and Rhythm: Normal rate and regular rhythm.     Pulses: Normal pulses.     Heart sounds: Normal heart sounds. No murmur. No friction rub. No gallop.   Pulmonary:     Effort: Pulmonary effort is normal. No respiratory distress.     Breath sounds: Normal breath sounds. No stridor. No wheezing, rhonchi or rales.  Chest:     Chest wall: No tenderness.  Abdominal:     General: Abdomen is flat. Bowel sounds are normal. There is no distension.     Palpations: Abdomen is soft. There is no mass.     Tenderness: There is no abdominal tenderness. There is no right CVA tenderness, left CVA tenderness, guarding or rebound.     Hernia: No hernia is present.   Musculoskeletal:        General: No tenderness. Normal range of motion.  Skin:    General: Skin is warm and dry.     Capillary Refill: Capillary refill takes less than 2 seconds.  Neurological:     Mental Status: She is alert and oriented to person, place, and time.     Coordination: Coordination normal.  Psychiatric:        Mood and Affect: Mood normal.        Behavior: Behavior normal.        Thought Content: Thought content normal.        Judgment: Judgment normal.       Assessment & Plan:   1. Healthcare maintenance   2. Mixed hyperlipidemia     Healthcare maintenance Overall labs look good, with elevation in total and LDL cholesterol- to help normalize-  reduce saturated fat in diet by at least 25%- continue your excellent walking program. Recommend checking lipid panel in 6 months- please schedule fasting lab appt. Increase water intake, strive to drink at least half of your body weight in ounces per day. If you develop any Gastroenterology sx's- call clinic for referral for Colonoscopy (due second degree relatives with history colon cancer). Continue to social distance and wear a mask when in public. Recommend annual physical with fasting labs.  Hyperlipemia The 10-year ASCVD risk score Mikey Bussing DC Jr., et al., 2013) is: 0.5%   Values used to calculate the score:     Age: 21 years     Sex: Female     Is Non-Hispanic African American: No     Diabetic: No     Tobacco smoker: No     Systolic Blood Pressure: 123456 mmHg     Is BP treated: No     HDL Cholesterol: 93 mg/dL     Total Cholesterol: 256 mg/dL Lifestyle encouraged Recheck lipids in 6 months     FOLLOW-UP:  Return in about 6 months (around 06/08/2020) for Fasting Labs.

## 2019-12-10 ENCOUNTER — Encounter: Payer: Self-pay | Admitting: Adult Health

## 2019-12-10 ENCOUNTER — Ambulatory Visit (INDEPENDENT_AMBULATORY_CARE_PROVIDER_SITE_OTHER): Payer: No Typology Code available for payment source | Admitting: Adult Health

## 2019-12-10 ENCOUNTER — Other Ambulatory Visit: Payer: Self-pay

## 2019-12-10 DIAGNOSIS — Z0000 Encounter for general adult medical examination without abnormal findings: Secondary | ICD-10-CM | POA: Diagnosis not present

## 2019-12-10 DIAGNOSIS — E785 Hyperlipidemia, unspecified: Secondary | ICD-10-CM | POA: Insufficient documentation

## 2019-12-10 DIAGNOSIS — E782 Mixed hyperlipidemia: Secondary | ICD-10-CM | POA: Diagnosis not present

## 2019-12-10 NOTE — Assessment & Plan Note (Signed)
Overall labs look good, with elevation in total and LDL cholesterol- to help normalize- reduce saturated fat in diet by at least 25%- continue your excellent walking program. Recommend checking lipid panel in 6 months- please schedule fasting lab appt. Increase water intake, strive to drink at least half of your body weight in ounces per day. If you develop any Gastroenterology sx's- call clinic for referral for Colonoscopy (due second degree relatives with history colon cancer). Continue to social distance and wear a mask when in public. Recommend annual physical with fasting labs.

## 2019-12-10 NOTE — Assessment & Plan Note (Signed)
The 10-year ASCVD risk score Mikey Bussing DC Brooke Bonito., et al., 2013) is: 0.5%   Values used to calculate the score:     Age: 44 years     Sex: Female     Is Non-Hispanic African American: No     Diabetic: No     Tobacco smoker: No     Systolic Blood Pressure: 123456 mmHg     Is BP treated: No     HDL Cholesterol: 93 mg/dL     Total Cholesterol: 256 mg/dL Lifestyle encouraged Recheck lipids in 6 months

## 2019-12-10 NOTE — Patient Instructions (Addendum)
Preventive Care for Adults, Female  A healthy lifestyle and preventive care can promote health and wellness. Preventive health guidelines for women include the following key practices.   A routine yearly physical is a good way to check with your health care provider about your health and preventive screening. It is a chance to share any concerns and updates on your health and to receive a thorough exam.   Visit your dentist for a routine exam and preventive care every 6 months. Brush your teeth twice a day and floss once a day. Good oral hygiene prevents tooth decay and gum disease.   The frequency of eye exams is based on your age, health, family medical history, use of contact lenses, and other factors. Follow your health care provider's recommendations for frequency of eye exams.   Eat a healthy diet. Foods like vegetables, fruits, whole grains, low-fat dairy products, and lean protein foods contain the nutrients you need without too many calories. Decrease your intake of foods high in solid fats, added sugars, and salt. Eat the right amount of calories for you.Get information about a proper diet from your health care provider, if necessary.   Regular physical exercise is one of the most important things you can do for your health. Most adults should get at least 150 minutes of moderate-intensity exercise (any activity that increases your heart rate and causes you to sweat) each week. In addition, most adults need muscle-strengthening exercises on 2 or more days a week.   Maintain a healthy weight. The body mass index (BMI) is a screening tool to identify possible weight problems. It provides an estimate of body fat based on height and weight. Your health care provider can find your BMI, and can help you achieve or maintain a healthy weight.For adults 20 years and older:   - A BMI below 18.5 is considered underweight.   - A BMI of 18.5 to 24.9 is normal.   - A BMI of 25 to 29.9 is  considered overweight.   - A BMI of 30 and above is considered obese.   Maintain normal blood lipids and cholesterol levels by exercising and minimizing your intake of trans and saturated fats.  Eat a balanced diet with plenty of fruit and vegetables. Blood tests for lipids and cholesterol should begin at age 20 and be repeated every 5 years minimum.  If your lipid or cholesterol levels are high, you are over 40, or you are at high risk for heart disease, you may need your cholesterol levels checked more frequently.Ongoing high lipid and cholesterol levels should be treated with medicines if diet and exercise are not working.   If you smoke, find out from your health care provider how to quit. If you do not use tobacco, do not start.   Lung cancer screening is recommended for adults aged 55-80 years who are at high risk for developing lung cancer because of a history of smoking. A yearly low-dose CT scan of the lungs is recommended for people who have at least a 30-pack-year history of smoking and are a current smoker or have quit within the past 15 years. A pack year of smoking is smoking an average of 1 pack of cigarettes a day for 1 year (for example: 1 pack a day for 30 years or 2 packs a day for 15 years). Yearly screening should continue until the smoker has stopped smoking for at least 15 years. Yearly screening should be stopped for people who develop a   health problem that would prevent them from having lung cancer treatment.   If you are pregnant, do not drink alcohol. If you are breastfeeding, be very cautious about drinking alcohol. If you are not pregnant and choose to drink alcohol, do not have more than 1 drink per day. One drink is considered to be 12 ounces (355 mL) of beer, 5 ounces (148 mL) of wine, or 1.5 ounces (44 mL) of liquor.   Avoid use of street drugs. Do not share needles with anyone. Ask for help if you need support or instructions about stopping the use of  drugs.   High blood pressure causes heart disease and increases the risk of stroke. Your blood pressure should be checked at least yearly.  Ongoing high blood pressure should be treated with medicines if weight loss and exercise do not work.   If you are 69-55 years old, ask your health care provider if you should take aspirin to prevent strokes.   Diabetes screening involves taking a blood sample to check your fasting blood sugar level. This should be done once every 3 years, after age 38, if you are within normal weight and without risk factors for diabetes. Testing should be considered at a younger age or be carried out more frequently if you are overweight and have at least 1 risk factor for diabetes.   Breast cancer screening is essential preventive care for women. You should practice "breast self-awareness."  This means understanding the normal appearance and feel of your breasts and may include breast self-examination.  Any changes detected, no matter how small, should be reported to a health care provider.  Women in their 80s and 30s should have a clinical breast exam (CBE) by a health care provider as part of a regular health exam every 1 to 3 years.  After age 66, women should have a CBE every year.  Starting at age 1, women should consider having a mammogram (breast X-ray test) every year.  Women who have a family history of breast cancer should talk to their health care provider about genetic screening.  Women at a high risk of breast cancer should talk to their health care providers about having an MRI and a mammogram every year.   -Breast cancer gene (BRCA)-related cancer risk assessment is recommended for women who have family members with BRCA-related cancers. BRCA-related cancers include breast, ovarian, tubal, and peritoneal cancers. Having family members with these cancers may be associated with an increased risk for harmful changes (mutations) in the breast cancer genes BRCA1 and  BRCA2. Results of the assessment will determine the need for genetic counseling and BRCA1 and BRCA2 testing.   The Pap test is a screening test for cervical cancer. A Pap test can show cell changes on the cervix that might become cervical cancer if left untreated. A Pap test is a procedure in which cells are obtained and examined from the lower end of the uterus (cervix).   - Women should have a Pap test starting at age 57.   - Between ages 90 and 70, Pap tests should be repeated every 2 years.   - Beginning at age 63, you should have a Pap test every 3 years as long as the past 3 Pap tests have been normal.   - Some women have medical problems that increase the chance of getting cervical cancer. Talk to your health care provider about these problems. It is especially important to talk to your health care provider if a  new problem develops soon after your last Pap test. In these cases, your health care provider may recommend more frequent screening and Pap tests.   - The above recommendations are the same for women who have or have not gotten the vaccine for human papillomavirus (HPV).   - If you had a hysterectomy for a problem that was not cancer or a condition that could lead to cancer, then you no longer need Pap tests. Even if you no longer need a Pap test, a regular exam is a good idea to make sure no other problems are starting.   - If you are between ages 36 and 66 years, and you have had normal Pap tests going back 10 years, you no longer need Pap tests. Even if you no longer need a Pap test, a regular exam is a good idea to make sure no other problems are starting.   - If you have had past treatment for cervical cancer or a condition that could lead to cancer, you need Pap tests and screening for cancer for at least 20 years after your treatment.   - If Pap tests have been discontinued, risk factors (such as a new sexual partner) need to be reassessed to determine if screening should  be resumed.   - The HPV test is an additional test that may be used for cervical cancer screening. The HPV test looks for the virus that can cause the cell changes on the cervix. The cells collected during the Pap test can be tested for HPV. The HPV test could be used to screen women aged 70 years and older, and should be used in women of any age who have unclear Pap test results. After the age of 67, women should have HPV testing at the same frequency as a Pap test.   Colorectal cancer can be detected and often prevented. Most routine colorectal cancer screening begins at the age of 57 years and continues through age 26 years. However, your health care provider may recommend screening at an earlier age if you have risk factors for colon cancer. On a yearly basis, your health care provider may provide home test kits to check for hidden blood in the stool.  Use of a small camera at the end of a tube, to directly examine the colon (sigmoidoscopy or colonoscopy), can detect the earliest forms of colorectal cancer. Talk to your health care provider about this at age 23, when routine screening begins. Direct exam of the colon should be repeated every 5 -10 years through age 49 years, unless early forms of pre-cancerous polyps or small growths are found.   People who are at an increased risk for hepatitis B should be screened for this virus. You are considered at high risk for hepatitis B if:  -You were born in a country where hepatitis B occurs often. Talk with your health care provider about which countries are considered high risk.  - Your parents were born in a high-risk country and you have not received a shot to protect against hepatitis B (hepatitis B vaccine).  - You have HIV or AIDS.  - You use needles to inject street drugs.  - You live with, or have sex with, someone who has Hepatitis B.  - You get hemodialysis treatment.  - You take certain medicines for conditions like cancer, organ  transplantation, and autoimmune conditions.   Hepatitis C blood testing is recommended for all people born from [birth year] through 1965 and any individual  with known risks for hepatitis C.   Practice safe sex. Use condoms and avoid high-risk sexual practices to reduce the spread of sexually transmitted infections (STIs). STIs include gonorrhea, chlamydia, syphilis, trichomonas, herpes, HPV, and human immunodeficiency virus (HIV). Herpes, HIV, and HPV are viral illnesses that have no cure. They can result in disability, cancer, and death. Sexually active women aged 25 years and younger should be checked for chlamydia. Older women with new or multiple partners should also be tested for chlamydia. Testing for other STIs is recommended if you are sexually active and at increased risk.   Osteoporosis is a disease in which the bones lose minerals and strength with aging. This can result in serious bone fractures or breaks. The risk of osteoporosis can be identified using a bone density scan. Women ages 65 years and over and women at risk for fractures or osteoporosis should discuss screening with their health care providers. Ask your health care provider whether you should take a calcium supplement or vitamin D to There are also several preventive steps women can take to avoid osteoporosis and resulting fractures or to keep osteoporosis from worsening. -->Recommendations include:  Eat a balanced diet high in fruits, vegetables, calcium, and vitamins.  Get enough calcium. The recommended total intake of is 1,200 mg daily; for best absorption, if taking supplements, divide doses into 250-500 mg doses throughout the day. Of the two types of calcium, calcium carbonate is best absorbed when taken with food but calcium citrate can be taken on an empty stomach.  Get enough vitamin D. NAMS and the National Osteoporosis Foundation recommend at least 1,000 IU per day for women age 50 and over who are at risk of vitamin D  deficiency. Vitamin D deficiency can be caused by inadequate sun exposure (for example, those who live in northern latitudes).  Avoid alcohol and smoking. Heavy alcohol intake (more than 7 drinks per week) increases the risk of falls and hip fracture and women smokers tend to lose bone more rapidly and have lower bone mass than nonsmokers. Stopping smoking is one of the most important changes women can make to improve their health and decrease risk for disease.  Be physically active every day. Weight-bearing exercise (for example, fast walking, hiking, jogging, and weight training) may strengthen bones or slow the rate of bone loss that comes with aging. Balancing and muscle-strengthening exercises can reduce the risk of falling and fracture.  Consider therapeutic medications. Currently, several types of effective drugs are available. Healthcare providers can recommend the type most appropriate for each woman.  Eliminate environmental factors that may contribute to accidents. Falls cause nearly 90% of all osteoporotic fractures, so reducing this risk is an important bone-health strategy. Measures include ample lighting, removing obstructions to walking, using nonskid rugs on floors, and placing mats and/or grab bars in showers.  Be aware of medication side effects. Some common medicines make bones weaker. These include a type of steroid drug called glucocorticoids used for arthritis and asthma, some antiseizure drugs, certain sleeping pills, treatments for endometriosis, and some cancer drugs. An overactive thyroid gland or using too much thyroid hormone for an underactive thyroid can also be a problem. If you are taking these medicines, talk to your doctor about what you can do to help protect your bones.reduce the rate of osteoporosis.    Menopause can be associated with physical symptoms and risks. Hormone replacement therapy is available to decrease symptoms and risks. You should talk to your  health care provider   about whether hormone replacement therapy is right for you.   Use sunscreen. Apply sunscreen liberally and repeatedly throughout the day. You should seek shade when your shadow is shorter than you. Protect yourself by wearing long sleeves, pants, a wide-brimmed hat, and sunglasses year round, whenever you are outdoors.   Once a month, do a whole body skin exam, using a mirror to look at the skin on your back. Tell your health care provider of new moles, moles that have irregular borders, moles that are larger than a pencil eraser, or moles that have changed in shape or color.   -Stay current with required vaccines (immunizations).   Influenza vaccine. All adults should be immunized every year.  Tetanus, diphtheria, and acellular pertussis (Td, Tdap) vaccine. Pregnant women should receive 1 dose of Tdap vaccine during each pregnancy. The dose should be obtained regardless of the length of time since the last dose. Immunization is preferred during the 27th 36th week of gestation. An adult who has not previously received Tdap or who does not know her vaccine status should receive 1 dose of Tdap. This initial dose should be followed by tetanus and diphtheria toxoids (Td) booster doses every 10 years. Adults with an unknown or incomplete history of completing a 3-dose immunization series with Td-containing vaccines should begin or complete a primary immunization series including a Tdap dose. Adults should receive a Td booster every 10 years.  Varicella vaccine. An adult without evidence of immunity to varicella should receive 2 doses or a second dose if she has previously received 1 dose. Pregnant females who do not have evidence of immunity should receive the first dose after pregnancy. This first dose should be obtained before leaving the health care facility. The second dose should be obtained 4 8 weeks after the first dose.  Human papillomavirus (HPV) vaccine. Females aged 13 26  years who have not received the vaccine previously should obtain the 3-dose series. The vaccine is not recommended for use in pregnant females. However, pregnancy testing is not needed before receiving a dose. If a female is found to be pregnant after receiving a dose, no treatment is needed. In that case, the remaining doses should be delayed until after the pregnancy. Immunization is recommended for any person with an immunocompromised condition through the age of 26 years if she did not get any or all doses earlier. During the 3-dose series, the second dose should be obtained 4 8 weeks after the first dose. The third dose should be obtained 24 weeks after the first dose and 16 weeks after the second dose.  Zoster vaccine. One dose is recommended for adults aged 60 years or older unless certain conditions are present.  Measles, mumps, and rubella (MMR) vaccine. Adults born before 1957 generally are considered immune to measles and mumps. Adults born in 1957 or later should have 1 or more doses of MMR vaccine unless there is a contraindication to the vaccine or there is laboratory evidence of immunity to each of the three diseases. A routine second dose of MMR vaccine should be obtained at least 28 days after the first dose for students attending postsecondary schools, health care workers, or international travelers. People who received inactivated measles vaccine or an unknown type of measles vaccine during 1963 1967 should receive 2 doses of MMR vaccine. People who received inactivated mumps vaccine or an unknown type of mumps vaccine before 1979 and are at high risk for mumps infection should consider immunization with 2 doses of   MMR vaccine. For females of childbearing age, rubella immunity should be determined. If there is no evidence of immunity, females who are not pregnant should be vaccinated. If there is no evidence of immunity, females who are pregnant should delay immunization until after pregnancy.  Unvaccinated health care workers born before [birth year] who lack laboratory evidence of measles, mumps, or rubella immunity or laboratory confirmation of disease should consider measles and mumps immunization with 2 doses of MMR vaccine or rubella immunization with 1 dose of MMR vaccine.  Pneumococcal 13-valent conjugate (PCV13) vaccine. When indicated, a person who is uncertain of her immunization history and has no record of immunization should receive the PCV13 vaccine. An adult aged 54 years or older who has certain medical conditions and has not been previously immunized should receive 1 dose of PCV13 vaccine. This PCV13 should be followed with a dose of pneumococcal polysaccharide (PPSV23) vaccine. The PPSV23 vaccine dose should be obtained at least 8 weeks after the dose of PCV13 vaccine. An adult aged 58 years or older who has certain medical conditions and previously received 1 or more doses of PPSV23 vaccine should receive 1 dose of PCV13. The PCV13 vaccine dose should be obtained 1 or more years after the last PPSV23 vaccine dose.  Pneumococcal polysaccharide (PPSV23) vaccine. When PCV13 is also indicated, PCV13 should be obtained first. All adults aged 58 years and older should be immunized. An adult younger than age 65 years who has certain medical conditions should be immunized. Any person who resides in a nursing home or long-term care facility should be immunized. An adult smoker should be immunized. People with an immunocompromised condition and certain other conditions should receive both PCV13 and PPSV23 vaccines. People with human immunodeficiency virus (HIV) infection should be immunized as soon as possible after diagnosis. Immunization during chemotherapy or radiation therapy should be avoided. Routine use of PPSV23 vaccine is not recommended for American Indians, [place] Natives, or people younger than 65 years unless there are medical conditions that require PPSV23 vaccine. When indicated,  people who have unknown immunization and have no record of immunization should receive PPSV23 vaccine. One-time revaccination 5 years after the first dose of PPSV23 is recommended for people aged 70 64 years who have chronic kidney failure, nephrotic syndrome, asplenia, or immunocompromised conditions. People who received 1 2 doses of PPSV23 before age 32 years should receive another dose of PPSV23 vaccine at age 96 years or later if at least 5 years have passed since the previous dose. Doses of PPSV23 are not needed for people immunized with PPSV23 at or after age 55 years.  Meningococcal vaccine. Adults with asplenia or persistent complement component deficiencies should receive 2 doses of quadrivalent meningococcal conjugate (MenACWY-D) vaccine. The doses should be obtained at least 2 months apart. Microbiologists working with certain meningococcal bacteria, [employer] recruits, people at risk during an outbreak, and people who travel to or live in countries with a high rate of meningitis should be immunized. A first-year college student up through age 58 years who is living in a residence hall should receive a dose if she did not receive a dose on or after her 16th birthday. Adults who have certain high-risk conditions should receive one or more doses of vaccine.  Hepatitis A vaccine. Adults who wish to be protected from this disease, have certain high-risk conditions, work with hepatitis A-infected animals, work in hepatitis A research labs, or travel to or work in countries with a high rate of hepatitis A should be  immunized. Adults who were previously unvaccinated and who anticipate close contact with an international adoptee during the first 60 days after arrival in the [place] from a country with a high rate of hepatitis A should be immunized.  Hepatitis B vaccine.  Adults who wish to be protected from this disease, have certain high-risk conditions, may be exposed to blood or other infectious  body fluids, are household contacts or sex partners of hepatitis B positive people, are clients or workers in certain care facilities, or travel to or work in countries with a high rate of hepatitis B should be immunized.  Haemophilus influenzae type b (Hib) vaccine. A previously unvaccinated person with asplenia or sickle cell disease or having a scheduled splenectomy should receive 1 dose of Hib vaccine. Regardless of previous immunization, a recipient of a hematopoietic stem cell transplant should receive a 3-dose series 6 12 months after her successful transplant. Hib vaccine is not recommended for adults with HIV infection.  Preventive Services / Frequency Ages 6 to 39years  Blood pressure check.** / Every 1 to 2 years.  Lipid and cholesterol check.** / Every 5 years beginning at age 39.  Clinical breast exam.** / Every 3 years for women in their 61s and 62s.  BRCA-related cancer risk assessment.** / For women who have family members with a BRCA-related cancer (breast, ovarian, tubal, or peritoneal cancers).  Pap test.** / Every 2 years from ages 47 through 85. Every 3 years starting at age 34 through age 12 or 74 with a history of 3 consecutive normal Pap tests.  HPV screening.** / Every 3 years from ages 46 through ages 43 to 54 with a history of 3 consecutive normal Pap tests.  Hepatitis C blood test.** / For any individual with known risks for hepatitis C.  Skin self-exam. / Monthly.  Influenza vaccine. / Every year.  Tetanus, diphtheria, and acellular pertussis (Tdap, Td) vaccine.** / Consult your health care provider. Pregnant women should receive 1 dose of Tdap vaccine during each pregnancy. 1 dose of Td every 10 years.  Varicella vaccine.** / Consult your health care provider. Pregnant females who do not have evidence of immunity should receive the first dose after pregnancy.  HPV vaccine. / 3 doses over 6 months, if 64 and younger. The vaccine is not recommended for use in  pregnant females. However, pregnancy testing is not needed before receiving a dose.  Measles, mumps, rubella (MMR) vaccine.** / You need at least 1 dose of MMR if you were born in 1957 or later. You may also need a 2nd dose. For females of childbearing age, rubella immunity should be determined. If there is no evidence of immunity, females who are not pregnant should be vaccinated. If there is no evidence of immunity, females who are pregnant should delay immunization until after pregnancy.  Pneumococcal 13-valent conjugate (PCV13) vaccine.** / Consult your health care provider.  Pneumococcal polysaccharide (PPSV23) vaccine.** / 1 to 2 doses if you smoke cigarettes or if you have certain conditions.  Meningococcal vaccine.** / 1 dose if you are age 71 to 37 years and a Market researcher living in a residence hall, or have one of several medical conditions, you need to get vaccinated against meningococcal disease. You may also need additional booster doses.  Hepatitis A vaccine.** / Consult your health care provider.  Hepatitis B vaccine.** / Consult your health care provider.  Haemophilus influenzae type b (Hib) vaccine.** / Consult your health care provider.  Ages 55 to 64years  Blood pressure check.** / Every 1 to 2 years.  Lipid and cholesterol check.** / Every 5 years beginning at age 20 years.  Lung cancer screening. / Every year if you are aged 55 80 years and have a 30-pack-year history of smoking and currently smoke or have quit within the past 15 years. Yearly screening is stopped once you have quit smoking for at least 15 years or develop a health problem that would prevent you from having lung cancer treatment.  Clinical breast exam.** / Every year after age 40 years.  BRCA-related cancer risk assessment.** / For women who have family members with a BRCA-related cancer (breast, ovarian, tubal, or peritoneal cancers).  Mammogram.** / Every year beginning at age 40  years and continuing for as long as you are in good health. Consult with your health care provider.  Pap test.** / Every 3 years starting at age 30 years through age 65 or 70 years with a history of 3 consecutive normal Pap tests.  HPV screening.** / Every 3 years from ages 30 years through ages 65 to 70 years with a history of 3 consecutive normal Pap tests.  Fecal occult blood test (FOBT) of stool. / Every year beginning at age 50 years and continuing until age 75 years. You may not need to do this test if you get a colonoscopy every 10 years.  Flexible sigmoidoscopy or colonoscopy.** / Every 5 years for a flexible sigmoidoscopy or every 10 years for a colonoscopy beginning at age 50 years and continuing until age 75 years.  Hepatitis C blood test.** / For all people born from 1945 through 1965 and any individual with known risks for hepatitis C.  Skin self-exam. / Monthly.  Influenza vaccine. / Every year.  Tetanus, diphtheria, and acellular pertussis (Tdap/Td) vaccine.** / Consult your health care provider. Pregnant women should receive 1 dose of Tdap vaccine during each pregnancy. 1 dose of Td every 10 years.  Varicella vaccine.** / Consult your health care provider. Pregnant females who do not have evidence of immunity should receive the first dose after pregnancy.  Zoster vaccine.** / 1 dose for adults aged 60 years or older.  Measles, mumps, rubella (MMR) vaccine.** / You need at least 1 dose of MMR if you were born in 1957 or later. You may also need a 2nd dose. For females of childbearing age, rubella immunity should be determined. If there is no evidence of immunity, females who are not pregnant should be vaccinated. If there is no evidence of immunity, females who are pregnant should delay immunization until after pregnancy.  Pneumococcal 13-valent conjugate (PCV13) vaccine.** / Consult your health care provider.  Pneumococcal polysaccharide (PPSV23) vaccine.** / 1 to 2 doses if  you smoke cigarettes or if you have certain conditions.  Meningococcal vaccine.** / Consult your health care provider.  Hepatitis A vaccine.** / Consult your health care provider.  Hepatitis B vaccine.** / Consult your health care provider.  Haemophilus influenzae type b (Hib) vaccine.** / Consult your health care provider.  Ages 65 years and over  Blood pressure check.** / Every 1 to 2 years.  Lipid and cholesterol check.** / Every 5 years beginning at age 20 years.  Lung cancer screening. / Every year if you are aged 55 80 years and have a 30-pack-year history of smoking and currently smoke or have quit within the past 15 years. Yearly screening is stopped once you have quit smoking for at least 15 years or develop a health problem that   would prevent you from having lung cancer treatment.  Clinical breast exam.** / Every year after age 103 years.  BRCA-related cancer risk assessment.** / For women who have family members with a BRCA-related cancer (breast, ovarian, tubal, or peritoneal cancers).  Mammogram.** / Every year beginning at age 36 years and continuing for as long as you are in good health. Consult with your health care provider.  Pap test.** / Every 3 years starting at age 5 years through age 85 or 10 years with 3 consecutive normal Pap tests. Testing can be stopped between 65 and 70 years with 3 consecutive normal Pap tests and no abnormal Pap or HPV tests in the past 10 years.  HPV screening.** / Every 3 years from ages 93 years through ages 70 or 45 years with a history of 3 consecutive normal Pap tests. Testing can be stopped between 65 and 70 years with 3 consecutive normal Pap tests and no abnormal Pap or HPV tests in the past 10 years.  Fecal occult blood test (FOBT) of stool. / Every year beginning at age 8 years and continuing until age 45 years. You may not need to do this test if you get a colonoscopy every 10 years.  Flexible sigmoidoscopy or colonoscopy.** /  Every 5 years for a flexible sigmoidoscopy or every 10 years for a colonoscopy beginning at age 69 years and continuing until age 68 years.  Hepatitis C blood test.** / For all people born from [birth year] through 1966 and any individual with known risks for hepatitis C.  Osteoporosis screening.** / A one-time screening for women ages 7 years and over and women at risk for fractures or osteoporosis.  Skin self-exam. / Monthly.  Influenza vaccine. / Every year.  Tetanus, diphtheria, and acellular pertussis (Tdap/Td) vaccine.** / 1 dose of Td every 10 years.  Varicella vaccine.** / Consult your health care provider.  Zoster vaccine.** / 1 dose for adults aged 5 years or older.  Pneumococcal 13-valent conjugate (PCV13) vaccine.** / Consult your health care provider.  Pneumococcal polysaccharide (PPSV23) vaccine.** / 1 dose for all adults aged 74 years and older.  Meningococcal vaccine.** / Consult your health care provider.  Hepatitis A vaccine.** / Consult your health care provider.  Hepatitis B vaccine.** / Consult your health care provider.  Haemophilus influenzae type b (Hib) vaccine.** / Consult your health care provider. ** Family history and personal history of risk and conditions may change your health care provider's recommendations. Document Released: 01/02/2002 Document Revised: 08/27/2013  [facility] Patient Information 2014 [place], [place].   EXERCISE AND DIET:  We recommended that you start or continue a regular exercise program for good health. Regular exercise means any activity that makes your heart beat faster and makes you sweat.  We recommend exercising at least 30 minutes per day at least 3 days a week, preferably 5.  We also recommend a diet low in fat and sugar / carbohydrates.  Inactivity, poor dietary choices and obesity can cause diabetes, heart attack, stroke, and kidney damage, among others.     ALCOHOL AND SMOKING:  Women should limit their alcohol intake to no  more than 7 drinks/beers/glasses of wine (combined, not each!) per week. Moderation of alcohol intake to this level decreases your risk of breast cancer and liver damage.  ( And of course, no recreational drugs are part of a healthy lifestyle.)  Also, you should not be smoking at all or even being exposed to second hand smoke. Most people know smoking can  cause cancer, and various heart and lung diseases, but did you know it also contributes to weakening of your bones?  Aging of your skin?  Yellowing of your teeth and nails?   CALCIUM AND VITAMIN D:  Adequate intake of calcium and Vitamin D are recommended.  The recommendations for exact amounts of these supplements seem to change often, but generally speaking 600 mg of calcium (either carbonate or citrate) and 800 units of Vitamin D per day seems prudent. Certain women may benefit from higher intake of Vitamin D.  If you are among these women, your doctor will have told you during your visit.     PAP SMEARS:  Pap smears, to check for cervical cancer or precancers,  have traditionally been done yearly, although recent scientific advances have shown that most women can have pap smears less often.  However, every woman still should have a physical exam from her gynecologist or primary care physician every year. It will include a breast check, inspection of the vulva and vagina to check for abnormal growths or skin changes, a visual exam of the cervix, and then an exam to evaluate the size and shape of the uterus and ovaries.  And after 45 years of age, a rectal exam is indicated to check for rectal cancers. We will also provide age appropriate advice regarding health maintenance, like when you should have certain vaccines, screening for sexually transmitted diseases, bone density testing, colonoscopy, mammograms, etc.    MAMMOGRAMS:  All women over 73 years old should have a yearly mammogram. Many facilities now offer a "3D" mammogram, which may cost  around $50 extra out of pocket. If possible,  we recommend you accept the option to have the 3D mammogram performed.  It both reduces the number of women who will be called back for extra views which then turn out to be normal, and it is better than the routine mammogram at detecting truly abnormal areas.     COLONOSCOPY:  Colonoscopy to screen for colon cancer is recommended for all women at age 57.  We know, you hate the idea of the prep.  We agree, BUT, having colon cancer and not knowing it is worse!!  Colon cancer so often starts as a polyp that can be seen and removed at colonscopy, which can quite literally save your life!  And if your first colonoscopy is normal and you have no family history of colon cancer, most women don't have to have it again for 10 years.  Once every ten years, you can do something that may end up saving your life, right?  We will be happy to help you get it scheduled when you are ready.  Be sure to check your insurance coverage so you understand how much it will cost.  It may be covered as a preventative service at no cost, but you should check your particular policy.     Overall labs look good, with elevation in total and LDL cholesterol- to help normalize- reduce saturated fat in diet by at least 25%- continue your excellent walking program. Recommend checking lipid panel in 6 months- please schedule fasting lab appt. Increase water intake, strive to drink at least half of your body weight in ounces per day. If you develop any Gastroenterology sx's- call clinic for referral for Colonoscopy (due second degree relatives with history colon cancer). Continue to social distance and wear a mask when in public. Recommend annual physical with fasting labs. GREAT TO SEE YOU!

## 2020-04-26 ENCOUNTER — Other Ambulatory Visit: Payer: Self-pay

## 2020-04-26 ENCOUNTER — Ambulatory Visit
Admission: RE | Admit: 2020-04-26 | Discharge: 2020-04-26 | Disposition: A | Discharge status: Home / Self Care | Payer: 59 | Source: Ambulatory Visit | Attending: Obstetrics & Gynecology | Admitting: Obstetrics & Gynecology

## 2020-04-26 ENCOUNTER — Ambulatory Visit
Admission: RE | Admit: 2020-04-26 | Discharge: 2020-04-26 | Disposition: A | Discharge status: Home / Self Care | Payer: No Typology Code available for payment source | Source: Ambulatory Visit | Attending: Obstetrics & Gynecology | Admitting: Obstetrics & Gynecology

## 2020-04-26 DIAGNOSIS — N6310 Unspecified lump in the right breast, unspecified quadrant: Secondary | ICD-10-CM

## 2020-06-07 ENCOUNTER — Other Ambulatory Visit: Payer: Self-pay | Admitting: Physician Assistant

## 2020-06-07 DIAGNOSIS — E782 Mixed hyperlipidemia: Secondary | ICD-10-CM

## 2020-06-07 DIAGNOSIS — Z0000 Encounter for general adult medical examination without abnormal findings: Secondary | ICD-10-CM

## 2020-06-08 ENCOUNTER — Other Ambulatory Visit: Payer: No Typology Code available for payment source

## 2020-06-08 ENCOUNTER — Other Ambulatory Visit: Payer: Self-pay

## 2020-06-08 DIAGNOSIS — E782 Mixed hyperlipidemia: Secondary | ICD-10-CM

## 2020-06-08 DIAGNOSIS — Z0000 Encounter for general adult medical examination without abnormal findings: Secondary | ICD-10-CM

## 2020-06-09 LAB — LIPID PANEL
Chol/HDL Ratio: 3 ratio (ref 0.0–4.4)
Cholesterol, Total: 240 mg/dL — ABNORMAL HIGH (ref 100–199)
HDL: 80 mg/dL (ref >39)
LDL Chol Calc (NIH): 152 mg/dL — ABNORMAL HIGH (ref 0–99)
Triglycerides: 50 mg/dL (ref 0–149)
VLDL Cholesterol Cal: 8 mg/dL (ref 5–40)

## 2020-06-09 LAB — COMPREHENSIVE METABOLIC PANEL
ALT: 10 IU/L (ref 0–32)
AST: 13 IU/L (ref 0–40)
Albumin/Globulin Ratio: 1.7 (ref 1.2–2.2)
Albumin: 4.3 g/dL (ref 3.8–4.8)
Alkaline Phosphatase: 34 IU/L — ABNORMAL LOW (ref 48–121)
BUN/Creatinine Ratio: 15 (ref 9–23)
BUN: 13 mg/dL (ref 6–24)
Bilirubin Total: 0.3 mg/dL (ref 0.0–1.2)
CO2: 21 mmol/L (ref 20–29)
Calcium: 9.1 mg/dL (ref 8.7–10.2)
Chloride: 103 mmol/L (ref 96–106)
Creatinine, Ser: 0.84 mg/dL (ref 0.57–1.00)
GFR calc Af Amer: 97 mL/min/{1.73_m2} (ref >59)
GFR calc non Af Amer: 84 mL/min/{1.73_m2} (ref >59)
Globulin, Total: 2.5 g/dL (ref 1.5–4.5)
Glucose: 85 mg/dL (ref 65–99)
Potassium: 4.2 mmol/L (ref 3.5–5.2)
Sodium: 137 mmol/L (ref 134–144)
Total Protein: 6.8 g/dL (ref 6.0–8.5)

## 2020-06-09 LAB — HEMOGLOBIN A1C
Est. average glucose Bld gHb Est-mCnc: 94 mg/dL
Hgb A1c MFr Bld: 4.9 % (ref 4.8–5.6)

## 2020-06-09 LAB — TSH: TSH: 1.87 u[IU]/mL (ref 0.450–4.500)

## 2020-06-09 LAB — CBC
Hematocrit: 39.9 % (ref 34.0–46.6)
Hemoglobin: 13.5 g/dL (ref 11.1–15.9)
MCH: 30.5 pg (ref 26.6–33.0)
MCHC: 33.8 g/dL (ref 31.5–35.7)
MCV: 90 fL (ref 79–97)
Platelets: 235 10*3/uL (ref 150–450)
RBC: 4.43 x10E6/uL (ref 3.77–5.28)
RDW: 12.2 % (ref 11.7–15.4)
WBC: 5 10*3/uL (ref 3.4–10.8)

## 2020-09-20 ENCOUNTER — Other Ambulatory Visit: Payer: Self-pay | Admitting: Obstetrics & Gynecology

## 2020-11-28 ENCOUNTER — Other Ambulatory Visit: Payer: Self-pay | Admitting: Physician Assistant

## 2020-11-28 DIAGNOSIS — Z0000 Encounter for general adult medical examination without abnormal findings: Secondary | ICD-10-CM

## 2020-11-28 DIAGNOSIS — E782 Mixed hyperlipidemia: Secondary | ICD-10-CM

## 2020-12-02 ENCOUNTER — Other Ambulatory Visit: Payer: No Typology Code available for payment source

## 2020-12-02 ENCOUNTER — Other Ambulatory Visit: Payer: Self-pay

## 2020-12-03 LAB — LIPID PANEL
Chol/HDL Ratio: 3 ratio (ref 0.0–4.4)
Cholesterol, Total: 267 mg/dL — ABNORMAL HIGH (ref 100–199)
HDL: 89 mg/dL (ref >39)
LDL Chol Calc (NIH): 165 mg/dL — ABNORMAL HIGH (ref 0–99)
Triglycerides: 81 mg/dL (ref 0–149)
VLDL Cholesterol Cal: 13 mg/dL (ref 5–40)

## 2020-12-03 LAB — COMPREHENSIVE METABOLIC PANEL
ALT: 12 IU/L (ref 0–32)
AST: 15 IU/L (ref 0–40)
Albumin/Globulin Ratio: 1.7 (ref 1.2–2.2)
Albumin: 4.4 g/dL (ref 3.8–4.8)
Alkaline Phosphatase: 38 IU/L — ABNORMAL LOW (ref 44–121)
BUN/Creatinine Ratio: 12 (ref 9–23)
BUN: 11 mg/dL (ref 6–24)
Bilirubin Total: 0.4 mg/dL (ref 0.0–1.2)
CO2: 25 mmol/L (ref 20–29)
Calcium: 9.4 mg/dL (ref 8.7–10.2)
Chloride: 102 mmol/L (ref 96–106)
Creatinine, Ser: 0.94 mg/dL (ref 0.57–1.00)
GFR calc Af Amer: 84 mL/min/{1.73_m2} (ref >59)
GFR calc non Af Amer: 73 mL/min/{1.73_m2} (ref >59)
Globulin, Total: 2.6 g/dL (ref 1.5–4.5)
Glucose: 92 mg/dL (ref 65–99)
Potassium: 4.2 mmol/L (ref 3.5–5.2)
Sodium: 138 mmol/L (ref 134–144)
Total Protein: 7 g/dL (ref 6.0–8.5)

## 2020-12-03 LAB — CBC WITH DIFFERENTIAL/PLATELET
Basophils Absolute: 0.1 10*3/uL (ref 0.0–0.2)
Basos: 1 %
EOS (ABSOLUTE): 0.1 10*3/uL (ref 0.0–0.4)
Eos: 2 %
Hematocrit: 41.5 % (ref 34.0–46.6)
Hemoglobin: 13.8 g/dL (ref 11.1–15.9)
Immature Grans (Abs): 0 10*3/uL (ref 0.0–0.1)
Immature Granulocytes: 0 %
Lymphocytes Absolute: 2.3 10*3/uL (ref 0.7–3.1)
Lymphs: 48 %
MCH: 29.5 pg (ref 26.6–33.0)
MCHC: 33.3 g/dL (ref 31.5–35.7)
MCV: 89 fL (ref 79–97)
Monocytes Absolute: 0.4 10*3/uL (ref 0.1–0.9)
Monocytes: 7 %
Neutrophils Absolute: 2 10*3/uL (ref 1.4–7.0)
Neutrophils: 42 %
Platelets: 263 10*3/uL (ref 150–450)
RBC: 4.68 x10E6/uL (ref 3.77–5.28)
RDW: 13.3 % (ref 11.7–15.4)
WBC: 4.9 10*3/uL (ref 3.4–10.8)

## 2020-12-03 LAB — HEMOGLOBIN A1C
Est. average glucose Bld gHb Est-mCnc: 100 mg/dL
Hgb A1c MFr Bld: 5.1 % (ref 4.8–5.6)

## 2020-12-03 LAB — TSH: TSH: 2.01 u[IU]/mL (ref 0.450–4.500)

## 2020-12-09 ENCOUNTER — Encounter: Payer: Self-pay | Admitting: Physician Assistant

## 2020-12-09 ENCOUNTER — Other Ambulatory Visit: Payer: Self-pay

## 2020-12-09 ENCOUNTER — Ambulatory Visit (INDEPENDENT_AMBULATORY_CARE_PROVIDER_SITE_OTHER): Payer: No Typology Code available for payment source | Admitting: Physician Assistant

## 2020-12-09 VITALS — BP 110/71 | HR 97 | Ht 67.5 in | Wt 167.2 lb

## 2020-12-09 DIAGNOSIS — Z1211 Encounter for screening for malignant neoplasm of colon: Secondary | ICD-10-CM | POA: Diagnosis not present

## 2020-12-09 DIAGNOSIS — E782 Mixed hyperlipidemia: Secondary | ICD-10-CM

## 2020-12-09 DIAGNOSIS — Z0000 Encounter for general adult medical examination without abnormal findings: Secondary | ICD-10-CM

## 2020-12-09 NOTE — Progress Notes (Signed)
Female Physical   Impression and Recommendations:    1. Healthcare maintenance   2. Mixed hyperlipidemia   3. Screening for colon cancer      1) Anticipatory Guidance: Skin CA prevention- recommend to use sunscreen when outside along with skin surveillance; eating a balanced and modest diet; physical activity at least 25 minutes per day or minimum of 150 min/ week moderate to intense activity.  2) Immunizations / Screenings / Labs:   All immunizations are up-to-date per recommendations or will be updated today if pt allows.    - Patient understands with dental and vision screens they will schedule independently.  - Obtained CBC, CMP, HgA1c, Lipid panel, TSH  when fasting, if not already done past 12 mo/ recently. Most labs are essentially within normal limits or stable from prior with exception of mild increase in LDL. Denies dietary changes and continues to stay active. The 10-year ASCVD risk score Mikey Bussing DC Brooke Bonito., et al., 2013) is: 0.5%   Values used to calculate the score:     Age: 45 years     Sex: Female     Is Non-Hispanic African American: No     Diabetic: No     Tobacco smoker: No     Systolic Blood Pressure: 188 mmHg     Is BP treated: No     HDL Cholesterol: 89 mg/dL     Total Cholesterol: 267 mg/dL -UTD on mammogram, pap smear (requesting most recent from OB-GYN), Tdap, Influenza and Covid vaccines. -Declined Hep C and HIV screenings.  3) Weight:  Recommend to improve diet habits to improve overall feelings of well being and objective health data. Improve nutrient density of diet through increasing intake of fruits and vegetables and decreasing saturated fats, white flour products and refined sugars.  4) Healthcare Maintenance: -Continue with physical activity regimen. -Follow a heart healthy diet. -Recommend to follow-up with dermatology for annual skin cancer screening. -Follow-up in 1 year for CPE and FBW.  Lab visit in 6 months to repeat lipid panel and  CMP.   No orders of the defined types were placed in this encounter.   Orders Placed This Encounter  Procedures  . Ambulatory referral to Gastroenterology     Return in about 1 year (around 12/09/2021) for CPE and FBW; lab visit in 6 months for lipid panel/cmp.   Gross side effects, risk and benefits, and alternatives of medications discussed with patient.  Patient is aware that all medications have potential side effects and we are unable to predict every side effect or drug-drug interaction that may occur.  Expresses verbal understanding and consents to current therapy plan and treatment regimen.  F-up preventative CPE in 1 year- this is in addition to any chronic care visits.    Please see orders placed and AVS handed out to patient at the end of our visit for further patient instructions/ counseling done pertaining to today's office visit.       Subjective:     CPE HPI: Krista Graham is a 45 y.o. female who presents to [place] at [facility] today for a yearly health maintenance exam.   Health Maintenance Summary  - Reviewed and updated, unless pt declines services.  Last Cologuard or Colonoscopy: Placed screening colonoscopy order Family history of Colon CA: Y, second-degree relatives  Tobacco History Reviewed:  Y, never a smoker Alcohol and/or drug use:    No concerns; no use Exercise Habits:   Moderate to rigorous physical activity Dental  Home: Yes Eye exams: Yes Dermatology home: Yes, not recently.  Female Health:  PAP Smear - last known results:  Followed by OB-GYN, requesting most recent pap STD concerns:   none Birth control method: Vasectomy (spouse) Menses regular:   Y, recently has noticed some irregularities  Lumps or breast concerns:  none Breast Cancer Family History:  Not completley sure    Additional concerns beyond health maintenance issues: none    Immunization History  Administered Date(s) Administered  .  Influenza-Unspecified 07/21/2017, 07/21/2018, 08/21/2019  . Tdap 03/09/2016     Health Maintenance  Topic Date Due  . Hepatitis C Screening  Never done  . COVID-19 Vaccine (1) Never done  . HIV Screening  Never done  . COLONOSCOPY (Pts 45-58yrs Insurance coverage will need to be confirmed)  Never done  . INFLUENZA VACCINE  06/20/2020  . PAP SMEAR-Modifier  08/30/2020  . TETANUS/TDAP  03/09/2026     Wt Readings from Last 3 Encounters:  12/09/20 167 lb 3.2 oz (75.8 kg)  12/10/19 162 lb 12.8 oz (73.8 kg)  05/28/19 172 lb (78 kg)   BP Readings from Last 3 Encounters:  12/09/20 110/71  12/10/19 125/73  05/28/19 121/72   Pulse Readings from Last 3 Encounters:  12/09/20 97  12/10/19 75  05/28/19 (!) 103     Past Medical History:  Diagnosis Date  . Cancer [facility]) 2013   basal cell carcinoma      Past Surgical History:  Procedure Laterality Date  . CESAREAN SECTION    . TONSILLECTOMY        Family History  Problem Relation Age of Onset  . Hypothyroidism Mother   . Healthy Daughter   . Healthy Son   . Diabetes Maternal Grandmother   . Heart disease Maternal Grandmother   . Heart disease Maternal Grandfather   . Hypertension Maternal Grandfather   . COPD Maternal Grandfather   . Healthy Son       Social History   Substance and Sexual Activity  Drug Use No  ,   Social History   Substance and Sexual Activity  Alcohol Use Yes   Comment: socially  ,   Social History   Tobacco Use  Smoking Status Never Smoker  Smokeless Tobacco Never Used  ,   Social History   Substance and Sexual Activity  Sexual Activity Yes  . Birth control/protection: Surgical    Current Outpatient Medications on File Prior to Visit  Medication Sig Dispense Refill  . ibuprofen (ADVIL,MOTRIN) 200 MG tablet Take 200 mg by mouth every 6 (six) hours as needed.    Marland Kitchen oxybutynin (DITROPAN-XL) 10 MG 24 hr tablet Take 1 tablet by mouth daily.     No current  facility-administered medications on file prior to visit.    Allergies: Patient has no known allergies.  Review of Systems: General:   Denies fever, chills, unexplained weight loss.  Optho/Auditory:   Denies visual changes, blurred vision/LOV Respiratory:   Denies SOB, DOE more than baseline levels.   Cardiovascular:   Denies chest pain, palpitations, new onset peripheral edema  Gastrointestinal:   Denies nausea, vomiting, diarrhea.  Genitourinary: Denies dysuria, freq/ urgency, flank pain  Endocrine:     Denies hot or cold intolerance, polyuria, polydipsia. Musculoskeletal:   Denies unexplained myalgias, joint swelling, gait problems.  Skin:  Denies rash, suspicious lesions Neurological:     Denies dizziness, unexplained weakness, numbness  Psychiatric/Behavioral:   Denies mood changes, suicidal or homicidal ideations, hallucinations  Objective:    Blood pressure 110/71, pulse 97, height 5' 7.5" (1.715 m), weight 167 lb 3.2 oz (75.8 kg), SpO2 99 %. Body mass index is 25.8 kg/m. General Appearance:    Alert, cooperative, no distress, appears stated age  Head:    Normocephalic, without obvious abnormality, atraumatic  Eyes:    PERRL, conjunctiva/corneas clear, EOM's intact, both eyes  Ears:    Normal TM's and external ear canals, both ears  Nose:   Nares normal, septum midline, mucosa normal, no drainage    or sinus tenderness  Throat:   Lips w/o lesion, mucosa moist, and tongue normal; teeth and   gums normal  Neck:   Supple, symmetrical, trachea midline, no adenopathy;    thyroid: no enlargement/tenderness/nodules; no JVD  Back:     Symmetric, no curvature, ROM normal, no CVA tenderness  Lungs:     Clear to auscultation bilaterally, respirations unlabored, no       Wh/ R/ R  Chest Wall:    No tenderness or gross deformity; normal excursion   Heart:    Regular rate and rhythm, S1 and S2 normal, no murmur, rub   or gallop  Breast Exam:    Deferred to Ob-Gyn  Abdomen:      Soft, non-tender, bowel sounds active all four quadrants, No   G/R/R, no masses, no organomegaly  Genitalia:    Deferred to Ob-Gyn  Rectal:    Deferred to Ob-Gyn  Extremities:   Extremities normal, atraumatic, no cyanosis or gross edema  Pulses:   2+ and symmetric all extremities  Skin:   Warm, dry, Skin color, texture, turgor normal, no obvious rashes or lesions Psych: No HI/SI, judgement and insight good, Euthymic mood. Full Affect.  Neurologic:   CNII-XII grossly intact, normal strength, sensation and reflexes throughout

## 2020-12-09 NOTE — Patient Instructions (Signed)
Heart-Healthy Eating Plan Heart-healthy meal planning includes:  Eating less unhealthy fats.  Eating more healthy fats.  Making other changes in your diet. Talk with your doctor or a diet specialist (dietitian) to create an eating plan that is right for you. What is my plan? Your doctor may recommend an eating plan that includes:  Total fat: ______% or less of total calories a day.  Saturated fat: ______% or less of total calories a day.  Cholesterol: less than _________mg a day. What are tips for following this plan? Cooking Avoid frying your food. Try to bake, boil, grill, or broil it instead. You can also reduce fat by:  Removing the skin from poultry.  Removing all visible fats from meats.  Steaming vegetables in water or broth. Meal planning  At meals, divide your plate into four equal parts: ? Fill one-half of your plate with vegetables and green salads. ? Fill one-fourth of your plate with whole grains. ? Fill one-fourth of your plate with lean protein foods.  Eat 4-5 servings of vegetables per day. A serving of vegetables is: ? 1 cup of raw or cooked vegetables. ? 2 cups of raw leafy greens.  Eat 4-5 servings of fruit per day. A serving of fruit is: ? 1 medium whole fruit. ?  cup of dried fruit. ?  cup of fresh, frozen, or canned fruit. ?  cup of 100% fruit juice.  Eat more foods that have soluble fiber. These are apples, broccoli, carrots, beans, peas, and barley. Try to get 20-30 g of fiber per day.  Eat 4-5 servings of nuts, legumes, and seeds per week: ? 1 serving of dried beans or legumes equals  cup after being cooked. ? 1 serving of nuts is  cup. ? 1 serving of seeds equals 1 tablespoon.   General information  Eat more home-cooked food. Eat less restaurant, buffet, and fast food.  Limit or avoid alcohol.  Limit foods that are high in starch and sugar.  Avoid fried foods.  Lose weight if you are overweight.  Keep track of how much salt  (sodium) you eat. This is important if you have high blood pressure. Ask your doctor to tell you more about this.  Try to add vegetarian meals each week. Fats  Choose healthy fats. These include olive oil and canola oil, flaxseeds, walnuts, almonds, and seeds.  Eat more omega-3 fats. These include salmon, mackerel, sardines, tuna, flaxseed oil, and ground flaxseeds. Try to eat fish at least 2 times each week.  Check food labels. Avoid foods with trans fats or high amounts of saturated fat.  Limit saturated fats. ? These are often found in animal products, such as meats, butter, and cream. ? These are also found in plant foods, such as palm oil, palm kernel oil, and coconut oil.  Avoid foods with partially hydrogenated oils in them. These have trans fats. Examples are stick margarine, some tub margarines, cookies, crackers, and other baked goods. What foods can I eat? Fruits All fresh, canned (in natural juice), or frozen fruits. Vegetables Fresh or frozen vegetables (raw, steamed, roasted, or grilled). Green salads. Grains Most grains. Choose whole wheat and whole grains most of the time. Rice and pasta, including brown rice and pastas made with whole wheat. Meats and other proteins Lean, well-trimmed beef, veal, pork, and lamb. Chicken and turkey without skin. All fish and shellfish. Wild duck, rabbit, pheasant, and venison. Egg whites or low-cholesterol egg substitutes. Dried beans, peas, lentils, and tofu. Seeds and   most nuts. Dairy Low-fat or nonfat cheeses, including ricotta and mozzarella. Skim or 1% milk that is liquid, powdered, or evaporated. Buttermilk that is made with low-fat milk. Nonfat or low-fat yogurt. Fats and oils Non-hydrogenated (trans-free) margarines. Vegetable oils, including soybean, sesame, sunflower, olive, peanut, safflower, corn, canola, and cottonseed. Salad dressings or mayonnaise made with a vegetable oil. Beverages Mineral water. Coffee and tea. Diet  carbonated beverages. Sweets and desserts Sherbet, gelatin, and fruit ice. Small amounts of dark chocolate. Limit all sweets and desserts. Seasonings and condiments All seasonings and condiments. The items listed above may not be a complete list of foods and drinks you can eat. Contact a dietitian for more options. What foods should I avoid? Fruits Canned fruit in heavy syrup. Fruit in cream or butter sauce. Fried fruit. Limit coconut. Vegetables Vegetables cooked in cheese, cream, or butter sauce. Fried vegetables. Grains Breads that are made with saturated or trans fats, oils, or whole milk. Croissants. Sweet rolls. Donuts. High-fat crackers, such as cheese crackers. Meats and other proteins Fatty meats, such as hot dogs, ribs, sausage, bacon, rib-eye roast or steak. High-fat deli meats, such as salami and bologna. Caviar. Domestic duck and goose. Organ meats, such as liver. Dairy Cream, sour cream, cream cheese, and creamed cottage cheese. Whole-milk cheeses. Whole or 2% milk that is liquid, evaporated, or condensed. Whole buttermilk. Cream sauce or high-fat cheese sauce. Yogurt that is made from whole milk. Fats and oils Meat fat, or shortening. Cocoa butter, hydrogenated oils, palm oil, coconut oil, palm kernel oil. Solid fats and shortenings, including bacon fat, salt pork, lard, and butter. Nondairy cream substitutes. Salad dressings with cheese or sour cream. Beverages Regular sodas and juice drinks with added sugar. Sweets and desserts Frosting. Pudding. Cookies. Cakes. Pies. Milk chocolate or white chocolate. Buttered syrups. Full-fat ice cream or ice cream drinks. The items listed above may not be a complete list of foods and drinks to avoid. Contact a dietitian for more information. Summary  Heart-healthy meal planning includes eating less unhealthy fats, eating more healthy fats, and making other changes in your diet.  Eat a balanced diet. This includes fruits and  vegetables, low-fat or nonfat dairy, lean protein, nuts and legumes, whole grains, and heart-healthy oils and fats. This information is not intended to replace advice given to you by your health care provider. Make sure you discuss any questions you have with your health care provider. Document Revised: 01/10/2018 Document Reviewed: 12/14/2017 Elsevier Patient Education  2021 [employer] Inc[employer]   Preventive Care 76-91 Years Old, Female Preventive care refers to lifestyle choices and visits with your health care provider that can promote health and wellness. This includes:  A yearly physical exam. This is also called an annual wellness visit.  Regular dental and eye exams.  Immunizations.  Screening for certain conditions.  Healthy lifestyle choices, such as: ? Eating a healthy diet. ? Getting regular exercise. ? Not using drugs or products that contain nicotine and tobacco. ? Limiting alcohol use. What can I expect for my preventive care visit? Physical exam Your health care provider will check your:  Height and weight. These may be used to calculate your BMI (body mass index). BMI is a measurement that tells if you are at a healthy weight.  Heart rate and blood pressure.  Body temperature.  Skin for abnormal spots. Counseling Your health care provider may ask you questions about your:  Past medical problems.  Family's medical history.  Alcohol, tobacco, and drug use.  Emotional well-being.  Home life and relationship well-being.  Sexual activity.  Diet, exercise, and sleep habits.  Work and work environment.  Access to firearms.  Method of birth control.  Menstrual cycle.  Pregnancy history. What immunizations do I need? Vaccines are usually given at various ages, according to a schedule. Your health care provider will recommend vaccines for you based on your age, medical history, and lifestyle or other factors, such as travel or where you work.   What  tests do I need? Blood tests  Lipid and cholesterol levels. These may be checked every 5 years, or more often if you are over 50 years old.  Hepatitis C test.  Hepatitis B test. Screening  Lung cancer screening. You may have this screening every year starting at age 55 if you have a 30-pack-year history of smoking and currently smoke or have quit within the past 15 years.  Colorectal cancer screening. ? All adults should have this screening starting at age 50 and continuing until age 75. ? Your health care provider may recommend screening at age 45 if you are at increased risk. ? You will have tests every 1-10 years, depending on your results and the type of screening test.  Diabetes screening. ? This is done by checking your blood sugar (glucose) after you have not eaten for a while (fasting). ? You may have this done every 1-3 years.  Mammogram. ? This may be done every 1-2 years. ? Talk with your health care provider about when you should start having regular mammograms. This may depend on whether you have a family history of breast cancer.  BRCA-related cancer screening. This may be done if you have a family history of breast, ovarian, tubal, or peritoneal cancers.  Pelvic exam and Pap test. ? This may be done every 3 years starting at age 21. ? Starting at age 30, this may be done every 5 years if you have a Pap test in combination with an HPV test. Other tests  STD (sexually transmitted disease) testing, if you are at risk.  Bone density scan. This is done to screen for osteoporosis. You may have this scan if you are at high risk for osteoporosis. Talk with your health care provider about your test results, treatment options, and if necessary, the need for more tests. Follow these instructions at home: Eating and drinking  Eat a diet that includes fresh fruits and vegetables, whole grains, lean protein, and low-fat dairy products.  Take vitamin and mineral supplements  as recommended by your health care provider.  Do not drink alcohol if: ? Your health care provider tells you not to drink. ? You are pregnant, may be pregnant, or are planning to become pregnant.  If you drink alcohol: ? Limit how much you have to 0-1 drink a day. ? Be aware of how much alcohol is in your drink. In the [place] one drink equals one 12 oz bottle of beer (355 mL), one 5 oz glass of wine (148 mL), or one 1 oz glass of hard liquor (44 mL).   Lifestyle  Take daily care of your teeth and gums. Brush your teeth every morning and night with fluoride toothpaste. Floss one time each day.  Stay active. Exercise for at least 30 minutes 5 or more days each week.  Do not use any products that contain nicotine or tobacco, such as cigarettes, e-cigarettes, and chewing tobacco. If you need help quitting, ask your health care provider.  Do not   use drugs.  If you are sexually active, practice safe sex. Use a condom or other form of protection to prevent STIs (sexually transmitted infections).  If you do not wish to become pregnant, use a form of birth control. If you plan to become pregnant, see your health care provider for a prepregnancy visit.  If told by your health care provider, take low-dose aspirin daily starting at age 83.  Find healthy ways to cope with stress, such as: ? Meditation, yoga, or listening to music. ? Journaling. ? Talking to a trusted person. ? Spending time with friends and family. Safety  Always wear your seat belt while driving or riding in a vehicle.  Do not drive: ? If you have been drinking alcohol. Do not ride with someone who has been drinking. ? When you are tired or distracted. ? While texting.  Wear a helmet and other protective equipment during sports activities.  If you have firearms in your house, make sure you follow all gun safety procedures. What's next?  Visit your health care provider once a year for an annual wellness visit.  Ask your  health care provider how often you should have your eyes and teeth checked.  Stay up to date on all vaccines. This information is not intended to replace advice given to you by your health care provider. Make sure you discuss any questions you have with your health care provider. Document Revised: 08/10/2020 Document Reviewed: 07/18/2018 Elsevier Patient Education  2021 [employer].

## 2020-12-24 ENCOUNTER — Other Ambulatory Visit: Payer: Self-pay | Admitting: Obstetrics & Gynecology

## 2021-02-28 ENCOUNTER — Other Ambulatory Visit: Payer: Self-pay

## 2021-02-28 ENCOUNTER — Ambulatory Visit (AMBULATORY_SURGERY_CENTER): Payer: No Typology Code available for payment source | Admitting: *Deleted

## 2021-02-28 VITALS — Ht 67.5 in | Wt 174.0 lb

## 2021-02-28 DIAGNOSIS — Z1211 Encounter for screening for malignant neoplasm of colon: Secondary | ICD-10-CM

## 2021-02-28 MED ORDER — SUPREP BOWEL PREP KIT 17.5-3.13-1.6 GM/177ML PO SOLN
1.0000 | Freq: Once | ORAL | 0 refills | Status: AC
  Filled 2021-02-28: qty 354, 1d supply, fill #0

## 2021-02-28 NOTE — Progress Notes (Signed)

## 2021-03-11 ENCOUNTER — Ambulatory Visit (AMBULATORY_SURGERY_CENTER): Payer: No Typology Code available for payment source | Admitting: Gastroenterology

## 2021-03-11 ENCOUNTER — Encounter: Payer: Self-pay | Admitting: Gastroenterology

## 2021-03-11 ENCOUNTER — Other Ambulatory Visit: Payer: Self-pay

## 2021-03-11 VITALS — BP 118/64 | HR 72 | Temp 98.4°F | Resp 14 | Ht 67.5 in | Wt 174.0 lb

## 2021-03-11 DIAGNOSIS — Z1211 Encounter for screening for malignant neoplasm of colon: Secondary | ICD-10-CM | POA: Diagnosis not present

## 2021-03-11 MED ORDER — SODIUM CHLORIDE 0.9 % IV SOLN: 500.0000 mL | Freq: Once | INTRAVENOUS | Status: DC

## 2021-03-11 NOTE — Op Note (Addendum)
[facility] Patient Name: Krista Graham Procedure Date: 03/11/2021 2:05 PM MRN: [id number] Endoscopist: Thornton Park MD, MD Age: 45 Referring MD:  Date of Birth: 24-Apr-1976 Gender: Female Account #: [id number] Procedure:                Colonoscopy Indications:              Screening for colorectal malignant neoplasm, This                            is the patient's first colonoscopy                           Maternal uncle and nephew with colon cancer at a                            young age                           Mother with colon polyps Medicines:                Monitored Anesthesia Care Procedure:                Pre-Anesthesia Assessment:                           - Prior to the procedure, a History and Physical                            was performed, and patient medications and                            allergies were reviewed. The patient's tolerance of                            previous anesthesia was also reviewed. The risks                            and benefits of the procedure and the sedation                            options and risks were discussed with the patient.                            All questions were answered, and informed consent                            was obtained. Prior Anticoagulants: The patient has                            taken no previous anticoagulant or antiplatelet                            agents. ASA Grade Assessment: II - A patient with  mild systemic disease. After reviewing the risks                            and benefits, the patient was deemed in                            satisfactory condition to undergo the procedure.                           After obtaining informed consent, the colonoscope                            was passed under direct vision. Throughout the                            procedure, the patient's blood pressure, pulse, and                            oxygen  saturations were monitored continuously. The                            Olympus [id number]) Colonoscope was                            introduced through the anus and advanced to the 3                            cm into the ileum. A second forward view of the                            right colon was performed. The colonoscopy was                            performed without difficulty. The patient tolerated                            the procedure well. The quality of the bowel                            preparation was good. The terminal ileum, ileocecal                            valve, appendiceal orifice, and rectum were                            photographed. Scope In: 2:10:40 PM Scope Out: 2:22:45 PM Scope Withdrawal Time: 0 hours 9 minutes 35 seconds  Total Procedure Duration: 0 hours 12 minutes 5 seconds  Findings:                 The perianal and digital rectal examinations were                            normal.  The entire examined colon appeared normal on direct                            and retroflexion views except for small internal                            hemorrhoids. Complications:            No immediate complications. Estimated Blood Loss:     Estimated blood loss: none. Impression:               - The entire examined colon is normal on direct and                            retroflexion views.                           - No specimens collected. Recommendation:           - Patient has a contact number available for                            emergencies. The signs and symptoms of potential                            delayed complications were discussed with the                            patient. Return to normal activities tomorrow.                            Written discharge instructions were provided to the                            patient.                           - Resume previous diet.                           -  Continue present medications.                           - Repeat colonoscopy in 5 years given the family                            history, earlier with any new symptoms.                           - Emerging evidence supports eating a diet of                            fruits, vegetables, grains, calcium, and yogurt                            while reducing red meat and alcohol may reduce the  risk of colon cancer.                           - Thank you for allowing me to be involved in your                            colon cancer prevention. Thornton Park MD, MD 03/11/2021 2:27:22 PM This report has been signed electronically.

## 2021-03-11 NOTE — Patient Instructions (Signed)
YOU HAD AN ENDOSCOPIC PROCEDURE TODAY AT THE [facility]:   Refer to the procedure report that was given to you for any specific questions about what was found during the examination.  If the procedure report does not answer your questions, please call your gastroenterologist to clarify.  If you requested that your care partner not be given the details of your procedure findings, then the procedure report has been included in a sealed envelope for you to review at your convenience later.  YOU SHOULD EXPECT: Some feelings of bloating in the abdomen. Passage of more gas than usual.  Walking can help get rid of the air that was put into your GI tract during the procedure and reduce the bloating. If you had a lower endoscopy (such as a colonoscopy or flexible sigmoidoscopy) you may notice spotting of blood in your stool or on the toilet paper. If you underwent a bowel prep for your procedure, you may not have a normal bowel movement for a few days.  Please Note:  You might notice some irritation and congestion in your nose or some drainage.  This is from the oxygen used during your procedure.  There is no need for concern and it should clear up in a day or so.  SYMPTOMS TO REPORT IMMEDIATELY:   Following lower endoscopy (colonoscopy or flexible sigmoidoscopy):  Excessive amounts of blood in the stool  Significant tenderness or worsening of abdominal pains  Swelling of the abdomen that is new, acute  Fever of 100F or higher   Following upper endoscopy (EGD)  Vomiting of blood or coffee ground material  New chest pain or pain under the shoulder blades  Painful or persistently difficult swallowing  New shortness of breath  Fever of 100F or higher  Black, tarry-looking stools  For urgent or emergent issues, a gastroenterologist can be reached at any hour by calling [phone]. Do not use MyChart messaging for urgent concerns.    DIET:  We do recommend a small meal at first, but  then you may proceed to your regular diet.  Drink plenty of fluids but you should avoid alcoholic beverages for 24 hours.  ACTIVITY:  You should plan to take it easy for the rest of today and you should NOT DRIVE or use heavy machinery until tomorrow (because of the sedation medicines used during the test).    FOLLOW UP: Our staff will call the number listed on your records 48-72 hours following your procedure to check on you and address any questions or concerns that you may have regarding the information given to you following your procedure. If we do not reach you, we will leave a message.  We will attempt to reach you two times.  During this call, we will ask if you have developed any symptoms of COVID 19. If you develop any symptoms (ie: fever, flu-like symptoms, shortness of breath, cough etc.) before then, please call [phone].  If you test positive for Covid 19 in the 2 weeks post procedure, please call and report this information to us.    If any biopsies were taken you will be contacted by phone or by letter within the next 1-3 weeks.  Please call us at [phone] if you have not heard about the biopsies in 3 weeks.    SIGNATURES/CONFIDENTIALITY: You and/or your care partner have signed paperwork which will be entered into your electronic medical record.  These signatures attest to the fact that that the information above on   your After Visit Summary has been reviewed and is understood.  Full responsibility of the confidentiality of this discharge information lies with you and/or your care-partner. 

## 2021-03-11 NOTE — Progress Notes (Signed)
Pt's states no medical or surgical changes since previsit or office visit.  Check-in-AER  Vital signs-CW 

## 2021-03-11 NOTE — Progress Notes (Signed)
Report given to PACU, vss 

## 2021-03-15 ENCOUNTER — Telehealth: Payer: Self-pay | Admitting: *Deleted

## 2021-03-15 NOTE — Telephone Encounter (Signed)
  Follow up Call-  Call back number 03/11/2021  Post procedure Call Back phone  # [phone]  Permission to leave phone message Yes  Some recent data might be hidden     Patient questions:  Do you have a fever, pain , or abdominal swelling? No. Pain Score  0 *  Have you tolerated food without any problems? Yes.    Have you been able to return to your normal activities? Yes.    Do you have any questions about your discharge instructions: Diet   No. Medications  No. Follow up visit  No.  Do you have questions or concerns about your Care? No.  Actions: * If pain score is 4 or above: No action needed, pain <4

## 2021-03-27 MED FILL — Oxybutynin Chloride Tab ER 24HR 10 MG: ORAL | 90 days supply | Qty: 90 | Fill #0 | Status: AC

## 2021-03-28 ENCOUNTER — Other Ambulatory Visit: Payer: Self-pay

## 2021-05-03 ENCOUNTER — Other Ambulatory Visit: Payer: Self-pay | Admitting: Obstetrics & Gynecology

## 2021-05-03 DIAGNOSIS — Z09 Encounter for follow-up examination after completed treatment for conditions other than malignant neoplasm: Secondary | ICD-10-CM

## 2021-05-19 ENCOUNTER — Other Ambulatory Visit: Payer: Self-pay

## 2021-05-19 LAB — HM PAP SMEAR: HM Pap smear: NEGATIVE

## 2021-05-19 MED ORDER — PHENTERMINE HCL 37.5 MG PO TABS
ORAL_TABLET | ORAL | 2 refills | Status: DC
  Filled 2021-05-19: qty 30, 30d supply, fill #0
  Filled 2021-06-13 – 2021-06-29 (×3): qty 30, 30d supply, fill #1
  Filled 2021-08-02 – 2021-08-12 (×2): qty 30, 30d supply, fill #2

## 2021-06-07 ENCOUNTER — Other Ambulatory Visit: Payer: Self-pay

## 2021-06-07 DIAGNOSIS — E782 Mixed hyperlipidemia: Secondary | ICD-10-CM

## 2021-06-07 DIAGNOSIS — Z136 Encounter for screening for cardiovascular disorders: Secondary | ICD-10-CM

## 2021-06-08 ENCOUNTER — Other Ambulatory Visit: Payer: Self-pay

## 2021-06-08 ENCOUNTER — Other Ambulatory Visit: Payer: No Typology Code available for payment source

## 2021-06-08 DIAGNOSIS — Z136 Encounter for screening for cardiovascular disorders: Secondary | ICD-10-CM

## 2021-06-08 DIAGNOSIS — E782 Mixed hyperlipidemia: Secondary | ICD-10-CM

## 2021-06-09 LAB — COMPREHENSIVE METABOLIC PANEL
ALT: 11 IU/L (ref 0–32)
AST: 13 IU/L (ref 0–40)
Albumin/Globulin Ratio: 1.7 (ref 1.2–2.2)
Albumin: 4.1 g/dL (ref 3.8–4.8)
Alkaline Phosphatase: 35 IU/L — ABNORMAL LOW (ref 44–121)
BUN/Creatinine Ratio: 15 (ref 9–23)
BUN: 13 mg/dL (ref 6–24)
Bilirubin Total: 0.3 mg/dL (ref 0.0–1.2)
CO2: 23 mmol/L (ref 20–29)
Calcium: 9.2 mg/dL (ref 8.7–10.2)
Chloride: 106 mmol/L (ref 96–106)
Creatinine, Ser: 0.88 mg/dL (ref 0.57–1.00)
Globulin, Total: 2.4 g/dL (ref 1.5–4.5)
Glucose: 88 mg/dL (ref 65–99)
Potassium: 4.4 mmol/L (ref 3.5–5.2)
Sodium: 141 mmol/L (ref 134–144)
Total Protein: 6.5 g/dL (ref 6.0–8.5)
eGFR: 82 mL/min/{1.73_m2} (ref >59)

## 2021-06-09 LAB — LIPID PANEL
Chol/HDL Ratio: 3 ratio (ref 0.0–4.4)
Cholesterol, Total: 233 mg/dL — ABNORMAL HIGH (ref 100–199)
HDL: 78 mg/dL (ref >39)
LDL Chol Calc (NIH): 146 mg/dL — ABNORMAL HIGH (ref 0–99)
Triglycerides: 52 mg/dL (ref 0–149)
VLDL Cholesterol Cal: 9 mg/dL (ref 5–40)

## 2021-06-13 MED FILL — Oxybutynin Chloride Tab ER 24HR 10 MG: ORAL | 90 days supply | Qty: 90 | Fill #1 | Status: CN

## 2021-06-14 ENCOUNTER — Other Ambulatory Visit: Payer: Self-pay

## 2021-06-15 ENCOUNTER — Other Ambulatory Visit: Payer: Self-pay

## 2021-06-15 MED FILL — Oxybutynin Chloride Tab ER 24HR 10 MG: ORAL | 90 days supply | Qty: 90 | Fill #1 | Status: AC

## 2021-06-27 ENCOUNTER — Ambulatory Visit
Admission: RE | Admit: 2021-06-27 | Discharge: 2021-06-27 | Disposition: A | Discharge status: Home / Self Care | Payer: No Typology Code available for payment source | Source: Ambulatory Visit | Attending: Obstetrics & Gynecology | Admitting: Obstetrics & Gynecology

## 2021-06-27 ENCOUNTER — Other Ambulatory Visit: Payer: Self-pay

## 2021-06-27 DIAGNOSIS — Z09 Encounter for follow-up examination after completed treatment for conditions other than malignant neoplasm: Secondary | ICD-10-CM

## 2021-06-27 LAB — HM MAMMOGRAPHY

## 2021-06-28 ENCOUNTER — Other Ambulatory Visit: Payer: Self-pay

## 2021-06-29 ENCOUNTER — Other Ambulatory Visit: Payer: Self-pay

## 2021-07-08 ENCOUNTER — Encounter: Payer: Self-pay | Admitting: Physician Assistant

## 2021-08-03 ENCOUNTER — Other Ambulatory Visit: Payer: Self-pay

## 2021-08-12 ENCOUNTER — Other Ambulatory Visit: Payer: Self-pay

## 2021-09-28 ENCOUNTER — Other Ambulatory Visit: Payer: Self-pay

## 2021-09-28 MED ORDER — OXYBUTYNIN CHLORIDE ER 10 MG PO TB24
10.0000 mg | ORAL_TABLET | Freq: Every morning | ORAL | 3 refills | Status: DC
  Filled 2021-09-28: qty 90, 90d supply, fill #0
  Filled 2022-01-02: qty 90, 90d supply, fill #1
  Filled 2022-04-04: qty 90, 90d supply, fill #2
  Filled 2022-07-03: qty 90, 90d supply, fill #3

## 2021-10-16 IMAGING — MG DIGITAL DIAGNOSTIC BILAT W/ TOMO W/ CAD
8 series · 8 of 24 positions shown · non-contrast
Comparison: Previous exam(s).

CLINICAL DATA: 45-year-old female presenting follow-up of likely
benign right breast masses.

EXAM:
DIGITAL DIAGNOSTIC BILATERAL MAMMOGRAM WITH TOMOSYNTHESIS AND CAD;
ULTRASOUND RIGHT BREAST LIMITED
TECHNIQUE: Bilateral digital diagnostic mammography and breast tomosynthesis
was performed. The images were evaluated with computer-aided
detection.; Targeted ultrasound examination of the right breast was
performed

[R MLO synth-2D]
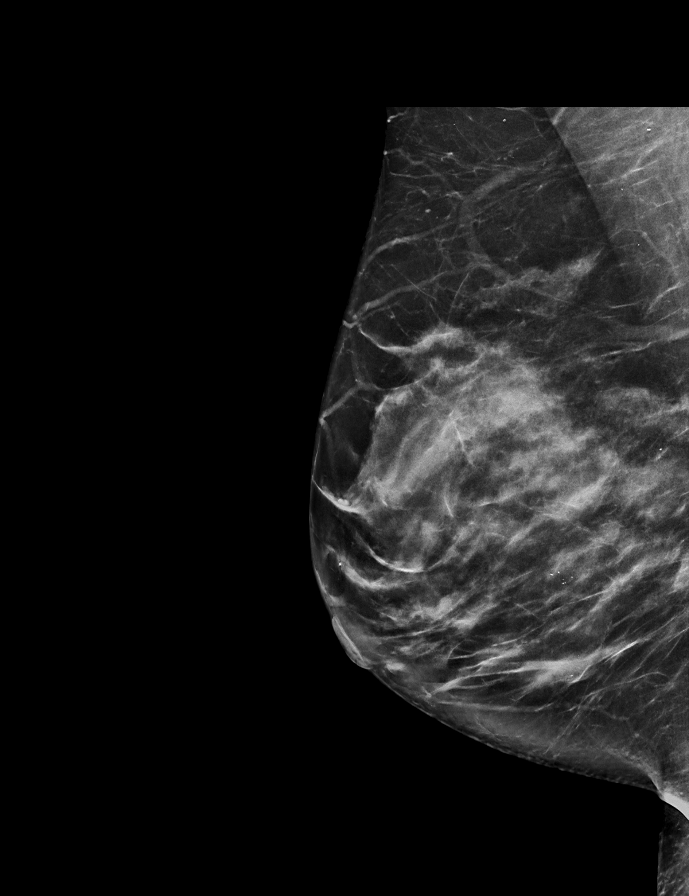

[R CC synth-2D]
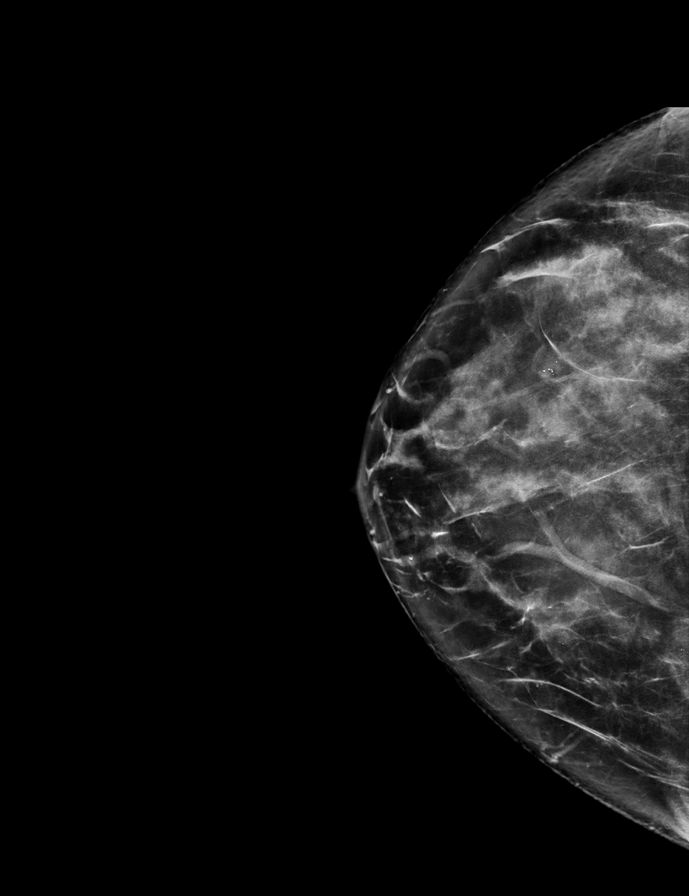

[L CC synth-2D]
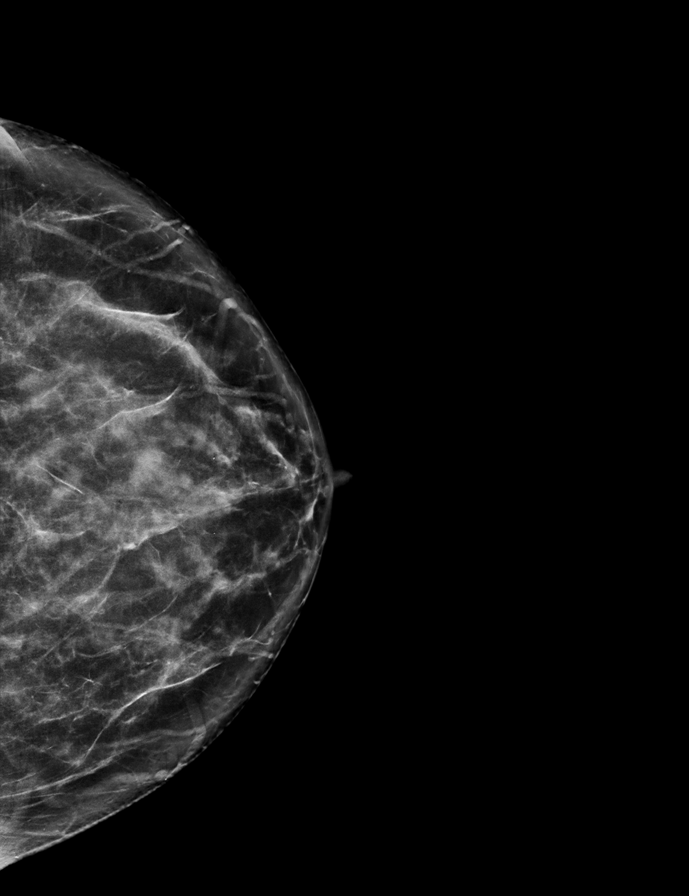

[L MLO synth-2D]
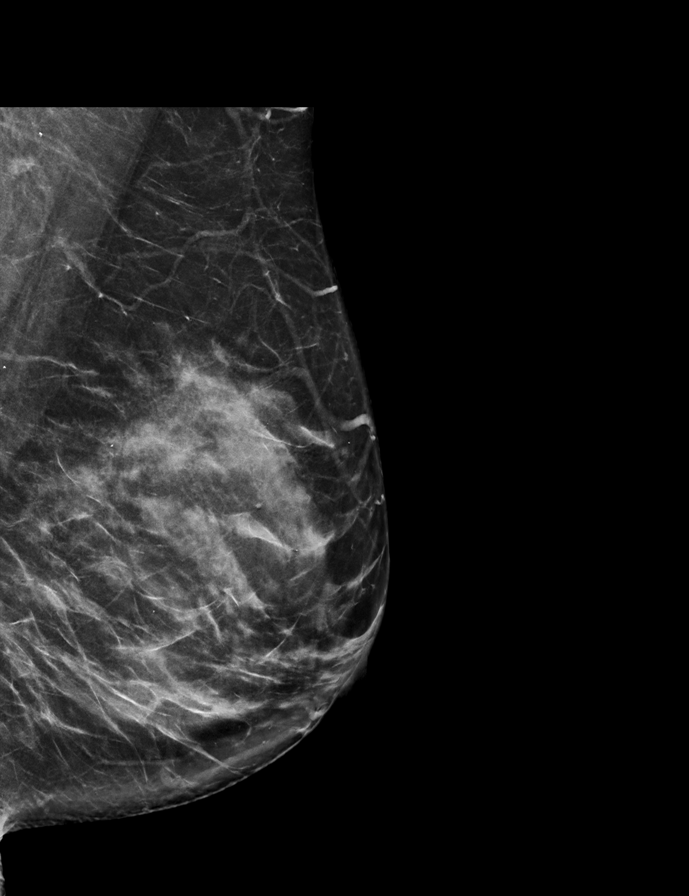

[R CC tomo · tomo slice 37/72.0]
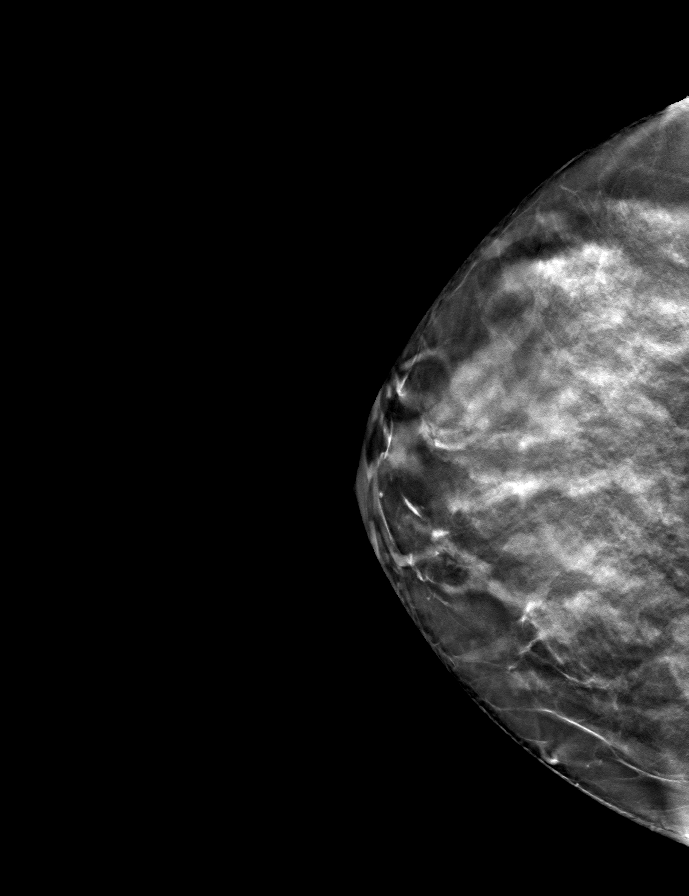

[R MLO tomo · tomo slice 39/77.0]
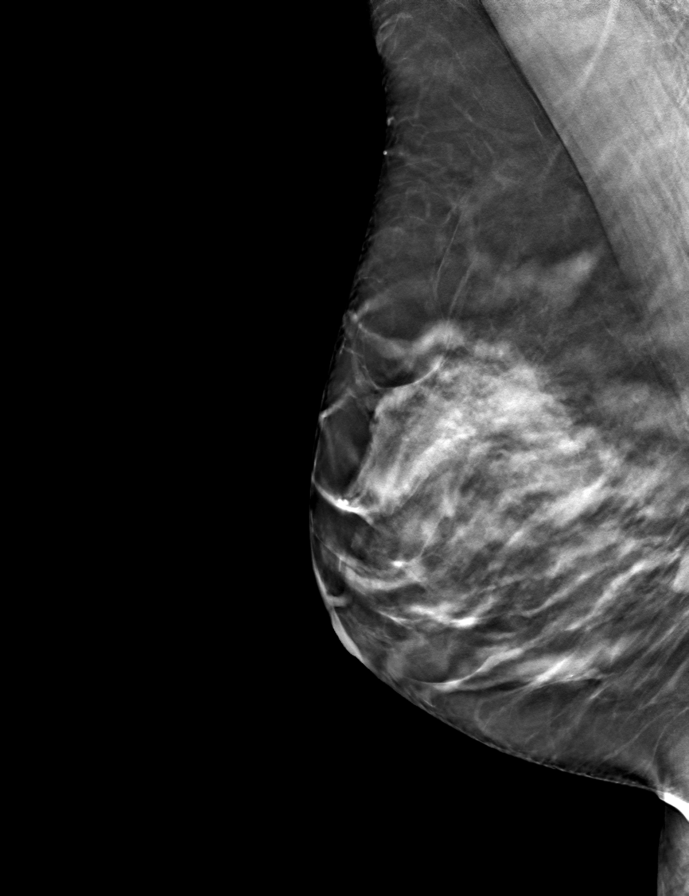

[L MLO tomo · tomo slice 39/78.0]
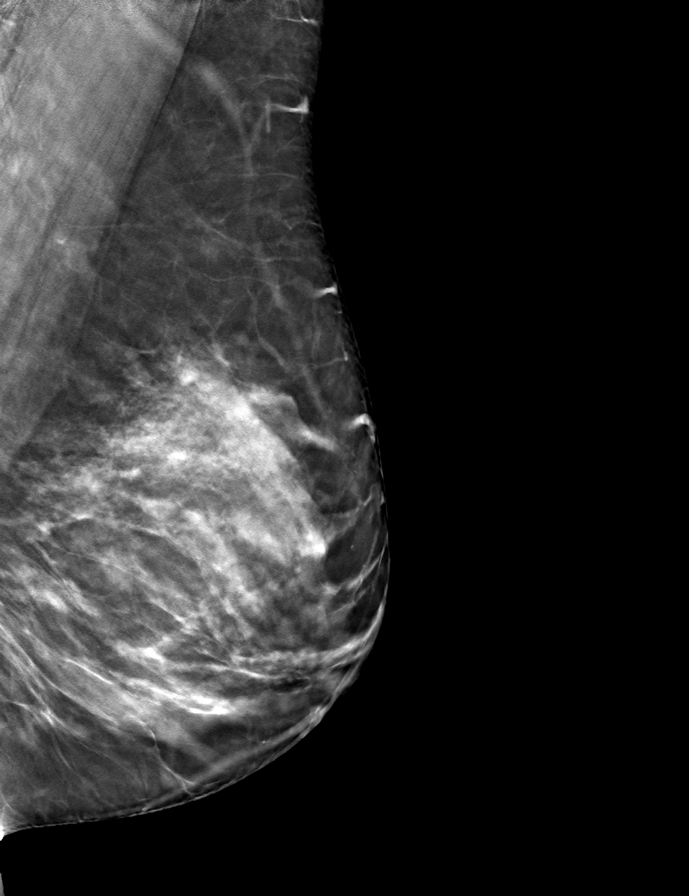

[L CC tomo · tomo slice 35/69.0]
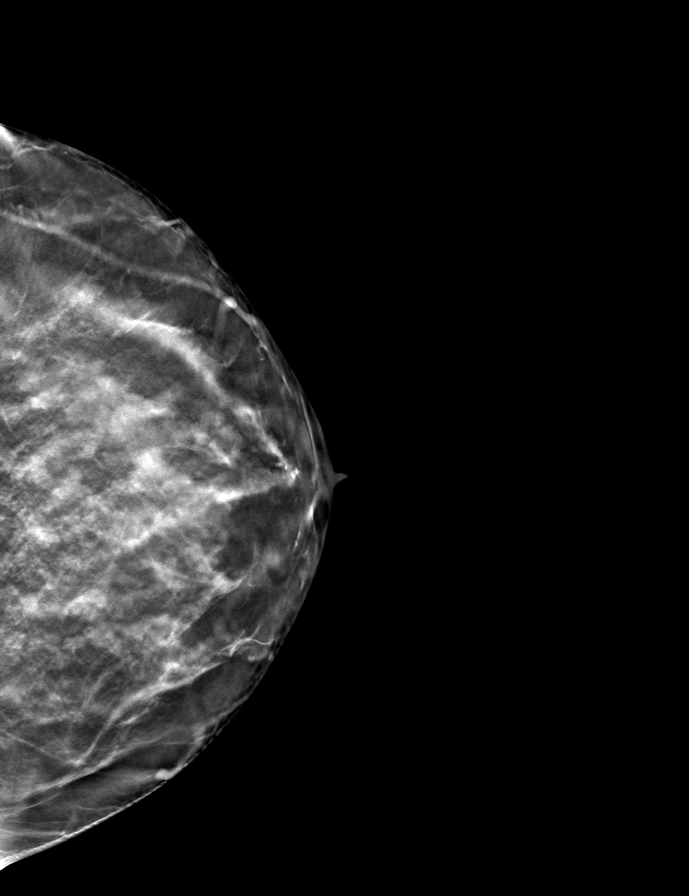

[8 of 24 positions shown; findings below may reference images not displayed]

ACR Breast Density Category c: The breast tissue is heterogeneously
dense, which may obscure small masses.
FINDINGS: No suspicious calcifications, masses or areas of distortion are seen
in the bilateral breasts.

Ultrasound targeted to the right breast at 9 o'clock, 2 cm from the
nipple demonstrates a stable mass measuring 0.9 x 0.4 x 0.8 cm,
previously measuring 0.9 x 0.4 x 0.8 cm.

Ultrasound of the right breast at 10 o'clock, 2 cm from the nipple
demonstrates a stable oval hypoechoic mass measuring 0.3 x 0.2 x
cm, previously measuring 0.3 x 0.2 x 0.2 cm.

The second oval hypoechoic mass at 10 o'clock, 2 cm from the nipple
measures 0.3 x 0.2 x 0.3 cm, previously 0.3 x 0.3 x 0.3 cm.
IMPRESSION: 1. The 3 likely benign right breast masses have demonstrated 2 years
of stability, and are therefore benign.

2.  No mammographic evidence of malignancy in the bilateral breasts.

RECOMMENDATION:
Screening mammogram in one year.(Code:B6-F-M7C)

I have discussed the findings and recommendations with the patient.
If applicable, a reminder letter will be sent to the patient
regarding the next appointment.

BI-RADS CATEGORY  2: Benign.

## 2021-11-17 ENCOUNTER — Other Ambulatory Visit: Payer: Self-pay

## 2021-11-17 MED ORDER — PHENTERMINE HCL 37.5 MG PO TABS
ORAL_TABLET | ORAL | 2 refills | Status: DC
  Filled 2021-11-17: qty 30, 30d supply, fill #0
  Filled 2021-12-19: qty 30, 30d supply, fill #1
  Filled 2022-01-17: qty 30, 30d supply, fill #2

## 2021-12-06 ENCOUNTER — Other Ambulatory Visit: Payer: No Typology Code available for payment source

## 2021-12-07 ENCOUNTER — Other Ambulatory Visit: Payer: Self-pay

## 2021-12-07 ENCOUNTER — Other Ambulatory Visit: Payer: No Typology Code available for payment source

## 2021-12-07 DIAGNOSIS — Z0000 Encounter for general adult medical examination without abnormal findings: Secondary | ICD-10-CM

## 2021-12-07 DIAGNOSIS — E782 Mixed hyperlipidemia: Secondary | ICD-10-CM

## 2021-12-07 DIAGNOSIS — Z130 Encounter for screening for diseases of the blood and blood-forming organs and certain disorders involving the immune mechanism: Secondary | ICD-10-CM

## 2021-12-07 DIAGNOSIS — Z1321 Encounter for screening for nutritional disorder: Secondary | ICD-10-CM

## 2021-12-08 LAB — CBC WITH DIFFERENTIAL/PLATELET
Basophils Absolute: 0 10*3/uL (ref 0.0–0.2)
Basos: 1 %
EOS (ABSOLUTE): 0.1 10*3/uL (ref 0.0–0.4)
Eos: 1 %
Hematocrit: 37.1 % (ref 34.0–46.6)
Hemoglobin: 12.7 g/dL (ref 11.1–15.9)
Immature Grans (Abs): 0 10*3/uL (ref 0.0–0.1)
Immature Granulocytes: 0 %
Lymphocytes Absolute: 1.7 10*3/uL (ref 0.7–3.1)
Lymphs: 37 %
MCH: 30.7 pg (ref 26.6–33.0)
MCHC: 34.2 g/dL (ref 31.5–35.7)
MCV: 90 fL (ref 79–97)
Monocytes Absolute: 0.3 10*3/uL (ref 0.1–0.9)
Monocytes: 6 %
Neutrophils Absolute: 2.5 10*3/uL (ref 1.4–7.0)
Neutrophils: 55 %
Platelets: 253 10*3/uL (ref 150–450)
RBC: 4.14 x10E6/uL (ref 3.77–5.28)
RDW: 12.3 % (ref 11.7–15.4)
WBC: 4.5 10*3/uL (ref 3.4–10.8)

## 2021-12-08 LAB — COMPREHENSIVE METABOLIC PANEL
ALT: 9 IU/L (ref 0–32)
AST: 13 IU/L (ref 0–40)
Albumin/Globulin Ratio: 1.7 (ref 1.2–2.2)
Albumin: 4.3 g/dL (ref 3.8–4.8)
Alkaline Phosphatase: 30 IU/L — ABNORMAL LOW (ref 44–121)
BUN/Creatinine Ratio: 10 (ref 9–23)
BUN: 10 mg/dL (ref 6–24)
Bilirubin Total: 0.5 mg/dL (ref 0.0–1.2)
CO2: 21 mmol/L (ref 20–29)
Calcium: 9.3 mg/dL (ref 8.7–10.2)
Chloride: 104 mmol/L (ref 96–106)
Creatinine, Ser: 0.97 mg/dL (ref 0.57–1.00)
Globulin, Total: 2.5 g/dL (ref 1.5–4.5)
Glucose: 88 mg/dL (ref 70–99)
Potassium: 4 mmol/L (ref 3.5–5.2)
Sodium: 139 mmol/L (ref 134–144)
Total Protein: 6.8 g/dL (ref 6.0–8.5)
eGFR: 73 mL/min/{1.73_m2} (ref >59)

## 2021-12-08 LAB — HEMOGLOBIN A1C
Est. average glucose Bld gHb Est-mCnc: 100 mg/dL
Hgb A1c MFr Bld: 5.1 % (ref 4.8–5.6)

## 2021-12-08 LAB — TSH: TSH: 1.98 u[IU]/mL (ref 0.450–4.500)

## 2021-12-08 LAB — LIPID PANEL
Chol/HDL Ratio: 3.2 ratio (ref 0.0–4.4)
Cholesterol, Total: 225 mg/dL — ABNORMAL HIGH (ref 100–199)
HDL: 70 mg/dL (ref >39)
LDL Chol Calc (NIH): 145 mg/dL — ABNORMAL HIGH (ref 0–99)
Triglycerides: 60 mg/dL (ref 0–149)
VLDL Cholesterol Cal: 10 mg/dL (ref 5–40)

## 2021-12-09 ENCOUNTER — Encounter: Payer: Self-pay | Admitting: Physician Assistant

## 2021-12-09 ENCOUNTER — Other Ambulatory Visit: Payer: Self-pay

## 2021-12-09 ENCOUNTER — Ambulatory Visit (INDEPENDENT_AMBULATORY_CARE_PROVIDER_SITE_OTHER): Payer: No Typology Code available for payment source | Admitting: Physician Assistant

## 2021-12-09 VITALS — BP 121/79 | HR 94 | Temp 98.2°F | Ht 68.0 in | Wt 177.0 lb

## 2021-12-09 DIAGNOSIS — Z0000 Encounter for general adult medical examination without abnormal findings: Secondary | ICD-10-CM

## 2021-12-09 DIAGNOSIS — E782 Mixed hyperlipidemia: Secondary | ICD-10-CM | POA: Diagnosis not present

## 2021-12-09 NOTE — Patient Instructions (Signed)

## 2021-12-09 NOTE — Progress Notes (Signed)
Complete physical exam   Patient: Krista Graham   DOB: July 14, 1976   46 y.o. Female  MRN: [id number] Visit Date: 12/09/2021   Chief Complaint  Patient presents with   Annual Exam   Subjective    Krista Graham is a 46 y.o. female who presents today for a complete physical exam.  She reports consuming a general diet. The patient does not participate in regular exercise at present. She generally feels fairly well. She does not have additional problems to discuss today.     Past Medical History:  Diagnosis Date   Allergy    Cancer ([place]) 2013   basal cell carcinoma   GERD (gastroesophageal reflux disease)    occ not dx'd    Hyperlipidemia    Past Surgical History:  Procedure Laterality Date   CESAREAN SECTION     x 2    NSVD     x1   TONSILLECTOMY     WISDOM TOOTH EXTRACTION     Social History   Socioeconomic History   Marital status: Married    Spouse name: Not on file   Number of children: Not on file   Years of education: Not on file   Highest education level: Not on file  Occupational History   Not on file  Tobacco Use   Smoking status: Never   Smokeless tobacco: Never  Vaping Use   Vaping Use: Never used  Substance and Sexual Activity   Alcohol use: Yes    Comment: socially   Drug use: No   Sexual activity: Yes    Birth control/protection: Surgical  Other Topics Concern   Not on file  Social History Narrative   Not on file   Social Determinants of Health   Financial Resource Strain: Not on file  Food Insecurity: Not on file  Transportation Needs: Not on file  Physical Activity: Not on file  Stress: Not on file  Social Connections: Not on file  Intimate Partner Violence: Not on file     Medications: Outpatient Medications Prior to Visit  Medication Sig   Norethindrone-Ethinyl Estradiol-Fe Biphas (LO LOESTRIN FE) 1 MG-10 MCG / 10 MCG tablet Take 1 tablet by mouth daily.   ibuprofen (ADVIL,MOTRIN) 200 MG tablet Take 200 mg by mouth every 6  (six) hours as needed.   oxybutynin (DITROPAN-XL) 10 MG 24 hr tablet TAKE 1 TABLET BY MOUTH ONCE DAILY IN THE MORNING.   phentermine (ADIPEX-P) 37.5 MG tablet Take 1 tablet every day by mouth in the morning.   [DISCONTINUED] oxybutynin (DITROPAN-XL) 10 MG 24 hr tablet Take 1 tablet by mouth daily.   [DISCONTINUED] oxybutynin (DITROPAN-XL) 10 MG 24 hr tablet TAKE 1 TABLET BY MOUTH ONCE DAILY IN THE MORNING. (Patient not taking: No sig reported)   [DISCONTINUED] phentermine (ADIPEX-P) 37.5 MG tablet Take 1 tablet by mouth daily in the morning.   No facility-administered medications prior to visit.    Review of Systems Review of Systems:  A fourteen system review of systems was performed and found to be positive as per HPI.    Objective    BP 121/79    Pulse 94    Temp 98.2 F (36.8 C)    Ht 5\' 8"  (1.727 m)    Wt 177 lb (80.3 kg)    SpO2 99%    BMI 26.91 kg/m    Physical Exam   General Appearance:     Well developed, well nourished female. Alert, cooperative, in no acute distress,  appears stated age   Head:    Normocephalic, without obvious abnormality, atraumatic  Eyes:    PERRL, conjunctiva/corneas clear, EOM's intact, fundi    benign, both eyes  Ears:    Normal TM's and external ear canals, both ears  Nose:   Nares normal, septum midline, mucosa normal, no drainage    or sinus tenderness  Throat:   Lips, mucosa, and tongue normal; teeth and gums normal  Neck:   Supple, symmetrical, trachea midline, no adenopathy;    thyroid:  normal to inspection and palpation; no  JVD  Back:     Symmetric, no curvature, ROM normal, no CVA tenderness  Lungs:     Clear to auscultation bilaterally, respirations unlabored  Chest Wall:    No tenderness or deformity   Heart:    Normal heart rate. Normal rhythm. No murmurs, rubs, or gallops.    Breast Exam:    deferred  Abdomen:     Soft, non-tender, bowel sounds active all four quadrants,    no masses, no organomegaly  Pelvic:    deferred   Extremities:   All extremities are intact. No cyanosis or edema  Pulses:   2+ and symmetric all extremities  Skin:   Skin color, texture, turgor normal, no rashes or lesions  Lymph nodes:   Cervical and supraclavicular nodes normal  Neurologic:   CNII-XII grossly intact     Last depression screening scores PHQ 2/9 Scores 12/09/2021 12/09/2020 12/10/2019  PHQ - 2 Score 1 1 2   PHQ- 9 Score 3 3 4    Last fall risk screening Fall Risk  12/09/2021  Falls in the past year? 0  Number falls in past yr: 0  Injury with Fall? 0  Risk for fall due to : No Fall Risks  Follow up Falls evaluation completed     No results found for any visits on 12/09/21.  Assessment & Plan    Routine Health Maintenance and Physical Exam  Exercise Activities and Dietary recommendations -Discussed a heart healthy diet and moderate aerobic exercise.  Immunization History  Administered Date(s) Administered   Influenza-Unspecified 07/21/2017, 07/21/2018, 08/21/2019, 09/08/2021   Janssen (J&J) SARS-COV-2 Vaccination 08/11/2020   Tdap 03/09/2016    Health Maintenance  Topic Date Due   HIV Screening  Never done   Hepatitis C Screening  Never done   PAP SMEAR-Modifier  08/30/2020   COVID-19 Vaccine (2 - Janssen risk series) 09/08/2020   TETANUS/TDAP  03/09/2026   COLONOSCOPY (Pts 45-74yrs Insurance coverage will need to be confirmed)  03/11/2026   INFLUENZA VACCINE  Completed   HPV VACCINES  Aged Out    Discussed health benefits of physical activity, and encouraged her to engage in regular exercise appropriate for her age and condition.  Problem List Items Addressed This Visit       Other   Healthcare maintenance - Primary   Hyperlipemia     Return in about 1 year (around 12/09/2022) for CPE and FBW .    Discussed with patient most recent lab results which are essentially within normal limits or stable from prior. Lipid panel remains elevated.  The 10-year ASCVD risk score (Arnett DK, et al.,  2019) is: 0.7%  Will request pap results from [facility].   Recommend to follow up with dermatology.  Pt declined Hep C and HIV screenings.  Lorrene Reid, PA-C  [facility] at [facility] [phone] (phone) [phone] (fax)  [place]

## 2021-12-19 ENCOUNTER — Other Ambulatory Visit: Payer: Self-pay

## 2022-01-03 ENCOUNTER — Other Ambulatory Visit: Payer: Self-pay

## 2022-01-17 ENCOUNTER — Other Ambulatory Visit: Payer: Self-pay

## 2022-02-08 ENCOUNTER — Other Ambulatory Visit: Payer: Self-pay

## 2022-02-08 MED ORDER — DIETHYLPROPION HCL ER 75 MG PO TB24
ORAL_TABLET | ORAL | 1 refills | Status: AC
  Filled 2022-02-08: qty 30, 30d supply, fill #0
  Filled 2022-03-09: qty 30, 30d supply, fill #1

## 2022-03-10 ENCOUNTER — Other Ambulatory Visit: Payer: Self-pay

## 2022-03-10 MED ORDER — AMOXICILLIN-POT CLAVULANATE 875-125 MG PO TABS
ORAL_TABLET | ORAL | 0 refills | Status: DC
  Filled 2022-03-10: qty 10, 5d supply, fill #0

## 2022-04-03 ENCOUNTER — Other Ambulatory Visit: Payer: Self-pay

## 2022-04-03 MED ORDER — PHENTERMINE HCL 37.5 MG PO TABS
ORAL_TABLET | ORAL | 2 refills | Status: DC
  Filled 2022-04-03: qty 30, 30d supply, fill #0
  Filled 2022-05-18: qty 30, 30d supply, fill #1
  Filled 2022-06-19: qty 30, 30d supply, fill #2

## 2022-04-04 ENCOUNTER — Other Ambulatory Visit: Payer: Self-pay

## 2022-05-19 ENCOUNTER — Other Ambulatory Visit: Payer: Self-pay

## 2022-06-19 ENCOUNTER — Other Ambulatory Visit: Payer: Self-pay

## 2022-06-20 ENCOUNTER — Other Ambulatory Visit: Payer: Self-pay

## 2022-07-04 ENCOUNTER — Other Ambulatory Visit: Payer: Self-pay

## 2022-07-06 ENCOUNTER — Other Ambulatory Visit: Payer: Self-pay

## 2022-07-06 LAB — HM MAMMOGRAPHY

## 2022-07-06 MED ORDER — DIETHYLPROPION HCL ER 75 MG PO TB24
ORAL_TABLET | ORAL | 1 refills | Status: DC
  Filled 2022-07-06: qty 30, 30d supply, fill #0
  Filled 2022-08-14: qty 30, 30d supply, fill #1

## 2022-07-07 ENCOUNTER — Other Ambulatory Visit: Payer: Self-pay

## 2022-07-11 ENCOUNTER — Encounter: Payer: Self-pay | Admitting: Physician Assistant

## 2022-08-15 ENCOUNTER — Other Ambulatory Visit: Payer: Self-pay

## 2022-08-31 ENCOUNTER — Other Ambulatory Visit: Payer: Self-pay

## 2022-08-31 MED ORDER — SERTRALINE HCL 50 MG PO TABS
ORAL_TABLET | ORAL | 0 refills | Status: DC
  Filled 2022-08-31: qty 90, 90d supply, fill #0

## 2022-08-31 MED ORDER — PHENTERMINE HCL 37.5 MG PO TABS
ORAL_TABLET | ORAL | 2 refills | Status: DC
  Filled 2022-08-31: qty 30, 30d supply, fill #0
  Filled 2022-11-02: qty 30, 30d supply, fill #1
  Filled 2022-12-24: qty 30, 30d supply, fill #2

## 2022-10-10 ENCOUNTER — Other Ambulatory Visit: Payer: Self-pay

## 2022-10-10 MED ORDER — OXYBUTYNIN CHLORIDE ER 10 MG PO TB24
10.0000 mg | ORAL_TABLET | Freq: Every morning | ORAL | 3 refills | Status: DC
  Filled 2022-10-10: qty 90, 90d supply, fill #0
  Filled 2023-01-11: qty 90, 90d supply, fill #1
  Filled 2023-04-23: qty 90, 90d supply, fill #2
  Filled 2023-07-24: qty 90, 90d supply, fill #3

## 2022-11-02 ENCOUNTER — Other Ambulatory Visit: Payer: Self-pay

## 2022-12-07 ENCOUNTER — Other Ambulatory Visit: Payer: Self-pay

## 2022-12-07 MED ORDER — SERTRALINE HCL 50 MG PO TABS
50.0000 mg | ORAL_TABLET | Freq: Every day | ORAL | 3 refills | Status: DC
  Filled 2022-12-07: qty 90, 90d supply, fill #0
  Filled 2023-03-05: qty 90, 90d supply, fill #1
  Filled 2023-06-13: qty 90, 90d supply, fill #2
  Filled 2023-09-16: qty 90, 90d supply, fill #3

## 2022-12-24 ENCOUNTER — Other Ambulatory Visit: Payer: Self-pay

## 2022-12-25 ENCOUNTER — Other Ambulatory Visit: Payer: Self-pay

## 2023-01-12 ENCOUNTER — Other Ambulatory Visit: Payer: Self-pay

## 2023-07-12 DIAGNOSIS — Z0000 Encounter for general adult medical examination without abnormal findings: Secondary | ICD-10-CM | POA: Diagnosis not present

## 2023-07-12 DIAGNOSIS — Z01419 Encounter for gynecological examination (general) (routine) without abnormal findings: Secondary | ICD-10-CM | POA: Diagnosis not present

## 2023-07-12 DIAGNOSIS — Z1231 Encounter for screening mammogram for malignant neoplasm of breast: Secondary | ICD-10-CM | POA: Diagnosis not present

## 2023-07-12 LAB — HM MAMMOGRAPHY

## 2023-07-13 ENCOUNTER — Other Ambulatory Visit: Payer: Self-pay

## 2023-07-13 LAB — LAB REPORT - SCANNED
A1c: 5
EGFR: 80

## 2023-07-13 MED ORDER — ROSUVASTATIN CALCIUM 10 MG PO TABS
10.0000 mg | ORAL_TABLET | Freq: Every evening | ORAL | 1 refills | Status: DC
  Filled 2023-07-13 (×2): qty 30, 30d supply, fill #0
  Filled 2023-08-14: qty 30, 30d supply, fill #1

## 2023-07-17 ENCOUNTER — Encounter: Payer: Self-pay | Admitting: Physician Assistant

## 2023-08-15 ENCOUNTER — Other Ambulatory Visit: Payer: Self-pay

## 2023-08-20 DIAGNOSIS — E7800 Pure hypercholesterolemia, unspecified: Secondary | ICD-10-CM | POA: Diagnosis not present

## 2023-08-21 LAB — LAB REPORT - SCANNED: EGFR: 70

## 2023-09-24 ENCOUNTER — Other Ambulatory Visit: Payer: Self-pay

## 2023-09-25 ENCOUNTER — Other Ambulatory Visit: Payer: Self-pay

## 2023-09-25 MED ORDER — ROSUVASTATIN CALCIUM 10 MG PO TABS
10.0000 mg | ORAL_TABLET | Freq: Every day | ORAL | 2 refills | Status: DC
  Filled 2023-09-25: qty 90, 90d supply, fill #0
  Filled 2023-12-20 – 2023-12-21 (×2): qty 90, 90d supply, fill #1
  Filled 2024-03-23: qty 90, 90d supply, fill #2

## 2023-10-10 ENCOUNTER — Other Ambulatory Visit: Payer: Self-pay

## 2023-10-10 MED ORDER — ESTRADIOL 1 MG PO TABS
1.0000 mg | ORAL_TABLET | Freq: Every day | ORAL | 1 refills | Status: AC
  Filled 2023-10-10: qty 30, 30d supply, fill #0

## 2023-10-10 MED ORDER — PHENTERMINE HCL 37.5 MG PO TABS
37.5000 mg | ORAL_TABLET | Freq: Every morning | ORAL | 2 refills | Status: DC
  Filled 2023-10-10: qty 30, 30d supply, fill #0
  Filled 2023-11-21: qty 30, 30d supply, fill #1
  Filled 2024-01-09: qty 30, 30d supply, fill #2

## 2023-10-24 ENCOUNTER — Other Ambulatory Visit: Payer: Self-pay

## 2023-10-24 MED ORDER — OXYBUTYNIN CHLORIDE ER 10 MG PO TB24
10.0000 mg | ORAL_TABLET | Freq: Every morning | ORAL | 3 refills | Status: DC
  Filled 2023-10-24: qty 90, 90d supply, fill #0
  Filled 2024-01-29: qty 90, 90d supply, fill #1
  Filled 2024-05-05: qty 90, 90d supply, fill #2
  Filled 2024-08-11: qty 90, 90d supply, fill #3

## 2023-11-22 ENCOUNTER — Other Ambulatory Visit: Payer: Self-pay

## 2023-12-20 ENCOUNTER — Encounter (HOSPITAL_COMMUNITY): Payer: Self-pay

## 2023-12-20 ENCOUNTER — Other Ambulatory Visit (HOSPITAL_COMMUNITY): Payer: Self-pay

## 2023-12-21 ENCOUNTER — Other Ambulatory Visit: Payer: Self-pay

## 2023-12-21 ENCOUNTER — Other Ambulatory Visit (HOSPITAL_COMMUNITY): Payer: Self-pay

## 2023-12-21 MED ORDER — SERTRALINE HCL 50 MG PO TABS
50.0000 mg | ORAL_TABLET | Freq: Every day | ORAL | 3 refills | Status: AC
  Filled 2023-12-21 (×2): qty 90, 90d supply, fill #0
  Filled 2024-03-23: qty 90, 90d supply, fill #1
  Filled 2024-03-25: qty 90, 90d supply, fill #0
  Filled 2024-07-07: qty 90, 90d supply, fill #1
  Filled 2024-10-19: qty 90, 90d supply, fill #2

## 2023-12-24 ENCOUNTER — Other Ambulatory Visit: Payer: Self-pay

## 2024-01-09 ENCOUNTER — Other Ambulatory Visit: Payer: Self-pay

## 2024-02-18 DIAGNOSIS — Z08 Encounter for follow-up examination after completed treatment for malignant neoplasm: Secondary | ICD-10-CM | POA: Diagnosis not present

## 2024-02-18 DIAGNOSIS — D229 Melanocytic nevi, unspecified: Secondary | ICD-10-CM | POA: Diagnosis not present

## 2024-02-18 DIAGNOSIS — Z7189 Other specified counseling: Secondary | ICD-10-CM | POA: Diagnosis not present

## 2024-02-18 DIAGNOSIS — Z85828 Personal history of other malignant neoplasm of skin: Secondary | ICD-10-CM | POA: Diagnosis not present

## 2024-02-18 DIAGNOSIS — L814 Other melanin hyperpigmentation: Secondary | ICD-10-CM | POA: Diagnosis not present

## 2024-02-18 DIAGNOSIS — L821 Other seborrheic keratosis: Secondary | ICD-10-CM | POA: Diagnosis not present

## 2024-03-25 ENCOUNTER — Other Ambulatory Visit: Payer: Self-pay

## 2024-03-25 ENCOUNTER — Other Ambulatory Visit (HOSPITAL_COMMUNITY): Payer: Self-pay

## 2024-03-31 ENCOUNTER — Telehealth: Admitting: Physician Assistant

## 2024-03-31 ENCOUNTER — Other Ambulatory Visit (HOSPITAL_COMMUNITY): Payer: Self-pay

## 2024-03-31 DIAGNOSIS — H1031 Unspecified acute conjunctivitis, right eye: Secondary | ICD-10-CM | POA: Diagnosis not present

## 2024-03-31 MED ORDER — POLYMYXIN B-TRIMETHOPRIM 10000-0.1 UNIT/ML-% OP SOLN
1.0000 [drp] | Freq: Four times a day (QID) | OPHTHALMIC | 0 refills | Status: AC
  Filled 2024-03-31: qty 10, 25d supply, fill #0

## 2024-03-31 NOTE — Progress Notes (Signed)

## 2024-03-31 NOTE — Progress Notes (Signed)
 I have spent 5 minutes in review of e-visit questionnaire, review and updating patient chart, medical decision making and response to patient.   Piedad Climes, PA-C

## 2024-06-24 ENCOUNTER — Other Ambulatory Visit (HOSPITAL_COMMUNITY): Payer: Self-pay

## 2024-06-24 MED ORDER — ROSUVASTATIN CALCIUM 10 MG PO TABS
10.0000 mg | ORAL_TABLET | Freq: Every day | ORAL | 2 refills | Status: AC
  Filled 2024-06-24: qty 90, 90d supply, fill #0
  Filled 2024-10-01: qty 90, 90d supply, fill #1

## 2024-07-16 DIAGNOSIS — F419 Anxiety disorder, unspecified: Secondary | ICD-10-CM | POA: Diagnosis not present

## 2024-07-16 DIAGNOSIS — Z6833 Body mass index (BMI) 33.0-33.9, adult: Secondary | ICD-10-CM | POA: Diagnosis not present

## 2024-07-16 DIAGNOSIS — Z1231 Encounter for screening mammogram for malignant neoplasm of breast: Secondary | ICD-10-CM | POA: Diagnosis not present

## 2024-07-16 DIAGNOSIS — N3941 Urge incontinence: Secondary | ICD-10-CM | POA: Diagnosis not present

## 2024-07-16 DIAGNOSIS — E7800 Pure hypercholesterolemia, unspecified: Secondary | ICD-10-CM | POA: Diagnosis not present

## 2024-07-16 DIAGNOSIS — Z01419 Encounter for gynecological examination (general) (routine) without abnormal findings: Secondary | ICD-10-CM | POA: Diagnosis not present

## 2024-07-16 DIAGNOSIS — Z0000 Encounter for general adult medical examination without abnormal findings: Secondary | ICD-10-CM | POA: Diagnosis not present

## 2024-07-16 DIAGNOSIS — Z124 Encounter for screening for malignant neoplasm of cervix: Secondary | ICD-10-CM | POA: Diagnosis not present

## 2024-07-16 DIAGNOSIS — Z3041 Encounter for surveillance of contraceptive pills: Secondary | ICD-10-CM | POA: Diagnosis not present

## 2024-07-16 LAB — HM MAMMOGRAPHY

## 2024-07-17 LAB — LAB REPORT - SCANNED
A1c: 5
EGFR (Non-African Amer.): 76
TSH: 1.3

## 2024-07-18 ENCOUNTER — Encounter: Payer: Self-pay | Admitting: Obstetrics & Gynecology

## 2024-09-18 DIAGNOSIS — R748 Abnormal levels of other serum enzymes: Secondary | ICD-10-CM | POA: Diagnosis not present

## 2024-09-19 ENCOUNTER — Other Ambulatory Visit (HOSPITAL_COMMUNITY): Payer: Self-pay

## 2024-09-19 ENCOUNTER — Other Ambulatory Visit: Payer: Self-pay

## 2024-09-19 LAB — LAB REPORT - SCANNED: EGFR: 68

## 2024-09-19 MED ORDER — PHENTERMINE HCL 37.5 MG PO TABS
37.5000 mg | ORAL_TABLET | Freq: Every morning | ORAL | 2 refills | Status: AC
  Filled 2024-09-19 (×2): qty 30, 30d supply, fill #0
  Filled 2024-10-19 – 2024-10-21 (×2): qty 30, 30d supply, fill #1
  Filled 2024-12-07: qty 30, 30d supply, fill #2

## 2024-10-20 ENCOUNTER — Other Ambulatory Visit (HOSPITAL_COMMUNITY): Payer: Self-pay

## 2024-10-20 ENCOUNTER — Other Ambulatory Visit: Payer: Self-pay

## 2024-12-07 ENCOUNTER — Other Ambulatory Visit (HOSPITAL_COMMUNITY): Payer: Self-pay

## 2024-12-08 ENCOUNTER — Other Ambulatory Visit: Payer: Self-pay

## 2024-12-08 ENCOUNTER — Other Ambulatory Visit (HOSPITAL_COMMUNITY): Payer: Self-pay

## 2024-12-08 MED ORDER — OXYBUTYNIN CHLORIDE ER 10 MG PO TB24
10.0000 mg | ORAL_TABLET | Freq: Every morning | ORAL | 3 refills | Status: AC
  Filled 2024-12-08: qty 90, 90d supply, fill #0

## 2024-12-09 ENCOUNTER — Other Ambulatory Visit (HOSPITAL_COMMUNITY): Payer: Self-pay
